# Patient Record
Sex: Female | Born: 2000 | Race: White | Hispanic: No | Smoking: Never smoker
Health system: Southern US, Community
[De-identification: ages and names within clinical notes are randomized; demographics above are authoritative.]

## PROBLEM LIST (undated history)

## (undated) DIAGNOSIS — O139 Gestational [pregnancy-induced] hypertension without significant proteinuria, unspecified trimester: Secondary | ICD-10-CM

## (undated) DIAGNOSIS — M419 Scoliosis, unspecified: Secondary | ICD-10-CM

## (undated) HISTORY — PX: TYMPANOSTOMY TUBE PLACEMENT: SHX32

## (undated) HISTORY — DX: Gestational (pregnancy-induced) hypertension without significant proteinuria, unspecified trimester: O13.9

## (undated) HISTORY — PX: WISDOM TOOTH EXTRACTION: SHX21

---
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
## 2018-01-10 ENCOUNTER — Other Ambulatory Visit: Payer: Self-pay

## 2018-01-10 ENCOUNTER — Encounter (HOSPITAL_COMMUNITY): Payer: Self-pay | Admitting: *Deleted

## 2018-01-10 ENCOUNTER — Emergency Department (HOSPITAL_COMMUNITY)
Admission: EM | Admit: 2018-01-10 | Discharge: 2018-01-11 | Disposition: A | Discharge status: Home / Self Care | Payer: BLUE CROSS/BLUE SHIELD | Attending: Emergency Medicine | Admitting: Emergency Medicine

## 2018-01-10 DIAGNOSIS — O9989 Other specified diseases and conditions complicating pregnancy, childbirth and the puerperium: Secondary | ICD-10-CM | POA: Diagnosis present

## 2018-01-10 DIAGNOSIS — Z3A01 Less than 8 weeks gestation of pregnancy: Secondary | ICD-10-CM | POA: Insufficient documentation

## 2018-01-10 DIAGNOSIS — O2341 Unspecified infection of urinary tract in pregnancy, first trimester: Secondary | ICD-10-CM | POA: Insufficient documentation

## 2018-01-10 DIAGNOSIS — R319 Hematuria, unspecified: Secondary | ICD-10-CM

## 2018-01-10 DIAGNOSIS — R102 Pelvic and perineal pain: Secondary | ICD-10-CM

## 2018-01-10 DIAGNOSIS — Z79899 Other long term (current) drug therapy: Secondary | ICD-10-CM | POA: Diagnosis not present

## 2018-01-10 DIAGNOSIS — Z3491 Encounter for supervision of normal pregnancy, unspecified, first trimester: Secondary | ICD-10-CM | POA: Insufficient documentation

## 2018-01-10 LAB — CBC WITH DIFFERENTIAL/PLATELET
Basophils Absolute: 0 10*3/uL (ref 0.0–0.1)
Basophils Relative: 0 %
Eosinophils Absolute: 0.3 10*3/uL (ref 0.0–0.7)
Eosinophils Relative: 3 %
HCT: 36.6 % (ref 36.0–46.0)
Hemoglobin: 11.5 g/dL — ABNORMAL LOW (ref 12.0–15.0)
Lymphocytes Relative: 18 %
Lymphs Abs: 1.9 10*3/uL (ref 0.7–4.0)
MCH: 27.3 pg (ref 26.0–34.0)
MCHC: 31.4 g/dL (ref 30.0–36.0)
MCV: 86.9 fL (ref 78.0–100.0)
Monocytes Absolute: 0.7 10*3/uL (ref 0.1–1.0)
Monocytes Relative: 6 %
Neutro Abs: 7.8 10*3/uL — ABNORMAL HIGH (ref 1.7–7.7)
Neutrophils Relative %: 73 %
Platelets: 243 10*3/uL (ref 150–400)
RBC: 4.21 MIL/uL (ref 3.87–5.11)
RDW: 13.2 % (ref 11.5–15.5)
WBC: 10.7 10*3/uL — ABNORMAL HIGH (ref 4.0–10.5)

## 2018-01-10 LAB — URINALYSIS, ROUTINE W REFLEX MICROSCOPIC
Bilirubin Urine: NEGATIVE
Glucose, UA: NEGATIVE mg/dL
Ketones, ur: 20 mg/dL — AB
Nitrite: NEGATIVE
Protein, ur: 100 mg/dL — AB
Specific Gravity, Urine: 1.028 (ref 1.005–1.030)
pH: 6 (ref 5.0–8.0)

## 2018-01-10 LAB — BASIC METABOLIC PANEL
Anion gap: 12 (ref 5–15)
BUN: 11 mg/dL (ref 6–20)
CO2: 21 mmol/L — ABNORMAL LOW (ref 22–32)
Calcium: 9.6 mg/dL (ref 8.9–10.3)
Chloride: 106 mmol/L (ref 101–111)
Creatinine, Ser: 0.87 mg/dL (ref 0.44–1.00)
GFR calc Af Amer: >60 mL/min (ref >60)
GFR calc non Af Amer: >60 mL/min (ref >60)
Glucose, Bld: 107 mg/dL — ABNORMAL HIGH (ref 65–99)
Potassium: 3.8 mmol/L (ref 3.5–5.1)
Sodium: 139 mmol/L (ref 135–145)

## 2018-01-10 LAB — HCG, QUANTITATIVE, PREGNANCY: hCG, Beta Chain, Quant, S: 71129 m[IU]/mL — ABNORMAL HIGH (ref <5)

## 2018-01-10 MED ORDER — HYDROMORPHONE HCL 1 MG/ML IJ SOLN
1.0000 mg | Freq: Once | INTRAMUSCULAR | Status: AC
  Administered 2018-01-10: 1 mg via INTRAVENOUS
  Filled 2018-01-10: qty 1

## 2018-01-10 MED ORDER — SODIUM CHLORIDE 0.9 % IV BOLUS (SEPSIS)
1000.0000 mL | Freq: Once | INTRAVENOUS | Status: AC
  Administered 2018-01-10: 1000 mL via INTRAVENOUS

## 2018-01-10 NOTE — ED Notes (Signed)
DR. Hyacinth MeekerMILLER AWARE OF THIS PT AND HER SYMPTOMS

## 2018-01-10 NOTE — ED Triage Notes (Addendum)
Pt reports left sided flank pain that radiates around to her lower abdomen. Pt also reports n/v and chills.  Pt states her last period started on January, 19th, 2019. EDD would be 08/28/18. (7weeks 1 day)

## 2018-01-11 ENCOUNTER — Emergency Department (HOSPITAL_COMMUNITY): Payer: BLUE CROSS/BLUE SHIELD

## 2018-01-11 MED ORDER — CEPHALEXIN 500 MG PO CAPS: 500.0000 mg | ORAL_CAPSULE | Freq: Two times a day (BID) | ORAL | 0 refills | Status: DC

## 2018-01-11 NOTE — ED Notes (Signed)
Pt to US at this time.

## 2018-01-11 NOTE — ED Notes (Signed)
Pt still in US

## 2018-01-11 NOTE — Discharge Instructions (Addendum)
See your Physician tomorrow as scheduled.   Return to the ER for any vomiting, fever above 100, back pain over the next 24-48 hours

## 2018-01-11 NOTE — ED Provider Notes (Signed)
[facility] EMERGENCY [facility]PARTMENT Provider Note   CSN: [id number] Arrival date & time: 01/10/18  2210     History   Chief Complaint Chief Complaint  Patient presents with  . Back Pain    HPI Shelby Acevedo is a 17 y.o. female.  The history is provided by the patient. No language interpreter was used.  Back Pain   This is a new problem. The current episode started 6 to 12 hours ago. The problem occurs constantly. The problem has been rapidly worsening. The pain is associated with no known injury. The quality of the pain is described as stabbing and shooting. The pain is the same all the time. Associated symptoms include abdominal pain. She has tried nothing for the symptoms.  Pt complains of low back pain that radiates to lower abdomen.  Pt is early pregnant.  Pt denies any bleeding.    Past Medical History:  Diagnosis Date  . Scoliosis     There are no active problems to display for this patient.   Past Surgical History:  Procedure Laterality Date  . TYMPANOSTOMY TUBE PLACEMENT      OB History    Gravida Para Term Preterm AB Living   1 0 0 0 0     SAB TAB Ectopic Multiple Live Births   0 0 0           Home Medications    Prior to Admission medications   Medication Sig Start Date End Date Taking? Authorizing Provider  DASETTA 1/35 tablet Take 1 tablet by mouth daily. 02/11/17   [provider]  ibuprofen (ADVIL,MOTRIN) 800 MG tablet Take 1 tablet (800 mg total) by mouth 3 (three) times daily. 05/08/17   Eber HongMiller, Brian, MD    Family History No family history on file.  Social History Social History   Tobacco Use  . Smoking status: Never Smoker  . Smokeless tobacco: Never Used  Substance Use Topics  . Alcohol use: No  . Drug use: No     Allergies   Patient has no known allergies.   Review of Systems Review of Systems  Gastrointestinal: Positive for abdominal pain.  Musculoskeletal: Positive for back pain.  All other systems reviewed and  are negative.    Physical Exam Updated Vital Signs BP 118/75 (BP Location: Right Arm)   Pulse 97   Temp 97.7 F (36.5 C) (Oral)   Resp 20   Ht 5\' 6"  (1.676 m)   Wt 72.2 kg (159 lb 4 oz)   LMP 11/21/2017 (Exact Date)   SpO2 100%   BMI 25.70 kg/m   Physical Exam  Constitutional: She is oriented to person, place, and time. She appears well-developed and well-nourished.  HENT:  Head: Normocephalic.  Right Ear: External ear normal.  Left Ear: External ear normal.  Mouth/Throat: Oropharynx is clear and moist.  Eyes: EOM are normal. Pupils are equal, round, and reactive to light.  Neck: Normal range of motion.  Cardiovascular: Normal rate and regular rhythm.  Pulmonary/Chest: Effort normal and breath sounds normal.  Abdominal: Soft. She exhibits no distension. There is tenderness.  Tender left lower abdomen.  Tender left flank  Musculoskeletal: Normal range of motion.  Neurological: She is alert and oriented to person, place, and time.  Skin: Skin is warm.  Psychiatric: She has a normal mood and affect.  Nursing note and vitals reviewed.    ED Treatments / Results  Labs (all labs ordered are listed, but only abnormal results are displayed) Labs  Reviewed  CBC WITH DIFFERENTIAL/PLATELET - Abnormal; Notable for the following components:      Result Value   WBC 10.7 (*)    Hemoglobin 11.5 (*)    Neutro Abs 7.8 (*)    All other components within normal limits  HCG, QUANTITATIVE, PREGNANCY - Abnormal; Notable for the following components:   hCG, Beta Chain, Quant, S 71,129 (*)    All other components within normal limits  BASIC METABOLIC PANEL - Abnormal; Notable for the following components:   CO2 21 (*)    Glucose, Bld 107 (*)    All other components within normal limits  URINALYSIS, ROUTINE W REFLEX MICROSCOPIC - Abnormal; Notable for the following components:   APPearance CLOUDY (*)    Hgb urine dipstick LARGE (*)    Ketones, ur 20 (*)    Protein, ur 100 (*)     Leukocytes, UA TRACE (*)    Bacteria, UA RARE (*)    Squamous Epithelial / LPF 6-30 (*)    All other components within normal limits    EKG  EKG Interpretation None      Bedside ultrasound by Dr. Hyacinth Meeker,  Probable left hydronephrosis.  Blood in urine,   We will obtain formal ultrasound due to pt's pain severity.   Radiology No results found.  Procedures Procedures (including critical care time)  Medications Ordered in ED Medications  sodium chloride 0.9 % bolus 1,000 mL (1,000 mLs Intravenous New Bag/Given 01/10/18 2248)  HYDROmorphone (DILAUDID) injection 1 mg (1 mg Intravenous Given 01/10/18 2337)     Initial Impression / Assessment and Plan / ED Course  I have reviewed the triage vital signs and the nursing notes.  Pertinent labs & imaging results that were available during my care of the patient were reviewed by me and considered in my medical decision making (see chart for details).       Final Clinical Impressions(s) / ED Diagnoses   Final diagnoses:  Hematuria, unspecified type  Normal IUP (intrauterine pregnancy) on prenatal ultrasound, first trimester    ED Discharge Orders        Ordered    cephALEXin (KEFLEX) 500 MG capsule  2 times daily     01/11/18 0236       Osie Cheeks 01/12/18 1032    Eber Hong, MD 01/12/18 1552

## 2018-01-11 NOTE — ED Provider Notes (Signed)
Ultrasound imaging reveals IUP, no complications.  Renal ultrasound was unremarkable per radiology.  Patient is improved.  Due to hematuria, white cells in urine, will add on Keflex for 7 days.  Advised to follow-up with OB.  We discussed strict return precautions   Zadie RhineWickline, Harmani Neto, MD 01/11/18 [phone]

## 2018-05-14 ENCOUNTER — Encounter

## 2018-06-25 ENCOUNTER — Emergency Department (HOSPITAL_COMMUNITY): Payer: Medicaid Other

## 2018-06-25 ENCOUNTER — Other Ambulatory Visit: Payer: Self-pay

## 2018-06-25 ENCOUNTER — Encounter (HOSPITAL_COMMUNITY): Payer: Self-pay | Admitting: Emergency Medicine

## 2018-06-25 ENCOUNTER — Emergency Department (HOSPITAL_COMMUNITY)
Admission: EM | Admit: 2018-06-25 | Discharge: 2018-06-25 | Disposition: A | Discharge status: Home / Self Care | Payer: Medicaid Other | Attending: Emergency Medicine | Admitting: Emergency Medicine

## 2018-06-25 DIAGNOSIS — S93402A Sprain of unspecified ligament of left ankle, initial encounter: Secondary | ICD-10-CM | POA: Diagnosis not present

## 2018-06-25 DIAGNOSIS — Z3A30 30 weeks gestation of pregnancy: Secondary | ICD-10-CM | POA: Insufficient documentation

## 2018-06-25 DIAGNOSIS — O9989 Other specified diseases and conditions complicating pregnancy, childbirth and the puerperium: Secondary | ICD-10-CM | POA: Diagnosis present

## 2018-06-25 DIAGNOSIS — R52 Pain, unspecified: Secondary | ICD-10-CM

## 2018-06-25 DIAGNOSIS — Z79899 Other long term (current) drug therapy: Secondary | ICD-10-CM | POA: Diagnosis not present

## 2018-06-25 DIAGNOSIS — Y33XXXA Other specified events, undetermined intent, initial encounter: Secondary | ICD-10-CM | POA: Insufficient documentation

## 2018-06-25 DIAGNOSIS — M79605 Pain in left leg: Secondary | ICD-10-CM | POA: Diagnosis not present

## 2018-06-25 DIAGNOSIS — Y939 Activity, unspecified: Secondary | ICD-10-CM | POA: Diagnosis not present

## 2018-06-25 DIAGNOSIS — Y998 Other external cause status: Secondary | ICD-10-CM | POA: Insufficient documentation

## 2018-06-25 DIAGNOSIS — Y929 Unspecified place or not applicable: Secondary | ICD-10-CM | POA: Diagnosis not present

## 2018-06-25 DIAGNOSIS — IMO0001 Reserved for inherently not codable concepts without codable children: Secondary | ICD-10-CM

## 2018-06-25 NOTE — ED Triage Notes (Addendum)
Patient complaining of left leg pain from thigh to foot x 1 week. Denies injury at that time but states she twisted her left ankle 2 nights ago and pain is worse. Patient is 8 months pregnant. Denies shortness of breath. Palpable pedal pulse at triage.

## 2018-06-25 NOTE — ED Notes (Signed)
[facility] RN recommended serial q15 min bp readings x3 and US of left extremity due to increased risk of DVT. PA aware.

## 2018-06-25 NOTE — ED Provider Notes (Signed)
[facility] EMERGENCY DEPARTMENT Provider Note   CSN: [id number] Arrival date & time: 06/25/18  1032     History   Chief Complaint Chief Complaint  Patient presents with  . Leg Pain    HPI Shelby Acevedo is a 17 y.o. female.  The history is provided by the patient.  Leg Pain   This is a new problem. The current episode started more than 2 days ago. The problem occurs constantly. The problem has been gradually worsening. The pain is present in the left ankle. Associated symptoms include limited range of motion. She has tried nothing for the symptoms. The treatment provided no relief.   Pt reports she has had soreness in her left upper leg for 2 weeks.  Pain in in the upper left outer leg.   Pt is [redacted] weeks pregnant.  She is followed by [facility] in [place] Past Medical History:  Diagnosis Date  . Scoliosis     There are no active problems to display for this patient.   Past Surgical History:  Procedure Laterality Date  . TYMPANOSTOMY TUBE PLACEMENT       OB History    Gravida  1   Para  0   Term  0   Preterm  0   AB  0   Living        SAB  0   TAB  0   Ectopic  0   Multiple      Live Births               Home Medications    Prior to Admission medications   Medication Sig Start Date End Date Taking? Authorizing Provider  cephALEXin (KEFLEX) 500 MG capsule Take 1 capsule (500 mg total) by mouth 2 (two) times daily. 01/11/18   Zadie Rhine, MD  DASETTA 1/35 tablet Take 1 tablet by mouth daily. 02/11/17   [provider]  ibuprofen (ADVIL,MOTRIN) 800 MG tablet Take 1 tablet (800 mg total) by mouth 3 (three) times daily. 05/08/17   Eber Hong, MD    Family History History reviewed. No pertinent family history.  Social History Social History   Tobacco Use  . Smoking status: Never Smoker  . Smokeless tobacco: Never Used  Substance Use Topics  . Alcohol use: No  . Drug use: No     Allergies   Patient has no known  allergies.   Review of Systems Review of Systems  All other systems reviewed and are negative.    Physical Exam Updated Vital Signs BP (!) 141/88 (BP Location: Right Arm)   Pulse 81   Temp 98.2 F (36.8 C) (Oral)   Resp 16   Ht 5\' 4"  (1.626 m)   Wt 78.5 kg   LMP 11/21/2017 (Exact Date)   SpO2 100%   BMI 29.70 kg/m   Physical Exam  Constitutional: She appears well-developed and well-nourished.  HENT:  Head: Normocephalic.  Musculoskeletal: She exhibits tenderness.  Tender left ankle,  Pain with movement, negative homan's   Neurological: She is alert.  Skin: Skin is warm.  Psychiatric: She has a normal mood and affect.  Nursing note and vitals reviewed.    ED Treatments / Results  Labs (all labs ordered are listed, but only abnormal results are displayed) Labs Reviewed - No data to display  EKG None  Radiology No results found.  Procedures Procedures (including critical care time)  Medications Ordered in ED Medications - No data to display   Initial Impression /  Assessment and Plan / ED Course  I have reviewed the triage vital signs and the nursing notes.  Pertinent labs & imaging results that were available during my care of the patient were reviewed by me and considered in my medical decision making (see chart for details).  Clinical Course as of Jun 25 1540  Fri Jun 25, 2018  1249 US Venous Img Lower Unilateral Left [LS]    Clinical Course User Index [LS] Osie CheeksSofia, Leslie K, PA-C    [facility] nurse reports normal strip,  Doppler study is normal.   Pt placed in aso for ankle sprain.  Pt advised tylenol for pain   Final Clinical Impressions(s) / ED Diagnoses   Final diagnoses:  Left leg pain  First degree ankle sprain, left, initial encounter    ED Discharge Orders    None    An After Visit Summary was printed and given to the patient.    Elson AreasSofia, Leslie K, [place]-C 06/25/18 1542    Eber HongMiller, Brian, MD 06/26/18 [phone]

## 2018-06-25 NOTE — ED Notes (Signed)
Call to Erin at [facility] for monitoring patient, recommendation for doppler for heart rate.

## 2018-06-25 NOTE — ED Notes (Signed)
Shelby Acevedo, [facility] with recommendations for OB clear, may take off the monitor, follow up with OB, discharge instructions as recommended.

## 2018-06-25 NOTE — Discharge Instructions (Signed)
Tylenol for pain.  See your Physician for recheck as scheduled

## 2018-06-25 NOTE — Progress Notes (Addendum)
1103 Received call about this 17 yo G1P0 @ 30.[redacted] wks GA in with complaint of left leg pain.  [place]  She has no pregnancy related complaints. 1109  Dr. Vergie LivingPickens consulted and informed of above.  Upon reviewing VS, BP noted to be elevated for pregnancy @ 141/88.  Dr. Vergie LivingPickens recommends repeat BP Q 15min X 3-4 and, if remains elevated pt will need pre-eclampsia labs and likely [facility]. In addition he recommends Doppler of left ankle. 1116 Dr. Vergie LivingPickens' recommendations passed on to ED RN.[id number] Dr. Vergie LivingPickens updated with serial BP readings and negative Doppler findings.  States pt can be OB cleared.

## 2018-06-25 NOTE — ED Notes (Signed)
Fetal heart rate by doppler 136.

## 2018-08-31 DIAGNOSIS — O1400 Mild to moderate pre-eclampsia, unspecified trimester: Secondary | ICD-10-CM

## 2019-03-09 ENCOUNTER — Emergency Department (HOSPITAL_COMMUNITY)
Admission: EM | Admit: 2019-03-09 | Discharge: 2019-03-09 | Disposition: A | Discharge status: Home / Self Care | Payer: Medicaid Other | Attending: Emergency Medicine | Admitting: Emergency Medicine

## 2019-03-09 ENCOUNTER — Encounter (HOSPITAL_COMMUNITY): Payer: Self-pay | Admitting: Emergency Medicine

## 2019-03-09 ENCOUNTER — Other Ambulatory Visit: Payer: Self-pay

## 2019-03-09 DIAGNOSIS — Z5321 Procedure and treatment not carried out due to patient leaving prior to being seen by health care provider: Secondary | ICD-10-CM | POA: Insufficient documentation

## 2019-03-09 DIAGNOSIS — M545 Low back pain: Secondary | ICD-10-CM | POA: Insufficient documentation
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
## 2019-08-04 ENCOUNTER — Other Ambulatory Visit: Payer: Self-pay

## 2019-08-04 DIAGNOSIS — Z20822 Contact with and (suspected) exposure to covid-19: Secondary | ICD-10-CM

## 2019-08-05 LAB — NOVEL CORONAVIRUS, NAA: SARS-CoV-2, NAA: NOT DETECTED

## 2019-08-31 ENCOUNTER — Encounter (HOSPITAL_COMMUNITY): Payer: Self-pay | Admitting: Emergency Medicine

## 2019-08-31 ENCOUNTER — Emergency Department (HOSPITAL_COMMUNITY)
Admission: EM | Admit: 2019-08-31 | Discharge: 2019-08-31 | Disposition: A | Discharge status: Home / Self Care | Payer: Medicaid Other | Attending: Emergency Medicine | Admitting: Emergency Medicine

## 2019-08-31 ENCOUNTER — Other Ambulatory Visit: Payer: Self-pay

## 2019-08-31 DIAGNOSIS — Z5321 Procedure and treatment not carried out due to patient leaving prior to being seen by health care provider: Secondary | ICD-10-CM | POA: Insufficient documentation

## 2019-08-31 DIAGNOSIS — M545 Low back pain: Secondary | ICD-10-CM | POA: Insufficient documentation

## 2019-08-31 DIAGNOSIS — R102 Pelvic and perineal pain: Secondary | ICD-10-CM | POA: Insufficient documentation

## 2019-08-31 DIAGNOSIS — R319 Hematuria, unspecified: Secondary | ICD-10-CM | POA: Insufficient documentation

## 2019-08-31 NOTE — ED Triage Notes (Signed)
Pt c/o left lower back pain with vaginal pain. Pt states there is blood in her urine that started this am.

## 2019-08-31 NOTE — ED Notes (Signed)
Registration seen pt leave the facility

## 2019-11-04 ENCOUNTER — Emergency Department (HOSPITAL_COMMUNITY)
Admission: EM | Admit: 2019-11-04 | Discharge: 2019-11-04 | Disposition: A | Discharge status: Home / Self Care | Payer: BC Managed Care – PPO | Attending: Emergency Medicine | Admitting: Emergency Medicine

## 2019-11-04 ENCOUNTER — Encounter (HOSPITAL_COMMUNITY): Payer: Self-pay

## 2019-11-04 ENCOUNTER — Emergency Department (HOSPITAL_COMMUNITY): Payer: BC Managed Care – PPO

## 2019-11-04 ENCOUNTER — Other Ambulatory Visit: Payer: Self-pay

## 2019-11-04 DIAGNOSIS — Z79899 Other long term (current) drug therapy: Secondary | ICD-10-CM | POA: Insufficient documentation

## 2019-11-04 DIAGNOSIS — Y92410 Unspecified street and highway as the place of occurrence of the external cause: Secondary | ICD-10-CM | POA: Diagnosis not present

## 2019-11-04 DIAGNOSIS — Y999 Unspecified external cause status: Secondary | ICD-10-CM | POA: Insufficient documentation

## 2019-11-04 DIAGNOSIS — S62355A Nondisplaced fracture of shaft of fourth metacarpal bone, left hand, initial encounter for closed fracture: Secondary | ICD-10-CM | POA: Insufficient documentation

## 2019-11-04 DIAGNOSIS — S6992XA Unspecified injury of left wrist, hand and finger(s), initial encounter: Secondary | ICD-10-CM | POA: Diagnosis present

## 2019-11-04 DIAGNOSIS — R0789 Other chest pain: Secondary | ICD-10-CM | POA: Insufficient documentation

## 2019-11-04 DIAGNOSIS — M25511 Pain in right shoulder: Secondary | ICD-10-CM | POA: Diagnosis not present

## 2019-11-04 DIAGNOSIS — Y9389 Activity, other specified: Secondary | ICD-10-CM | POA: Insufficient documentation

## 2019-11-04 DIAGNOSIS — T1490XA Injury, unspecified, initial encounter: Secondary | ICD-10-CM

## 2019-11-04 MED ORDER — HYDROCODONE-ACETAMINOPHEN 5-325 MG PO TABS: 1.0000 | ORAL_TABLET | Freq: Four times a day (QID) | ORAL | 0 refills | Status: DC | PRN

## 2019-11-04 MED ORDER — OXYCODONE-ACETAMINOPHEN 5-325 MG PO TABS
1.0000 | ORAL_TABLET | Freq: Once | ORAL | Status: AC
  Administered 2019-11-04: 02:00:00 1 via ORAL
  Filled 2019-11-04: qty 1
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
## 2019-12-07 ENCOUNTER — Other Ambulatory Visit: Payer: Self-pay

## 2019-12-07 ENCOUNTER — Ambulatory Visit: Payer: Medicaid Other | Attending: Internal Medicine

## 2019-12-07 DIAGNOSIS — Z20822 Contact with and (suspected) exposure to covid-19: Secondary | ICD-10-CM

## 2019-12-08 LAB — NOVEL CORONAVIRUS, NAA: SARS-CoV-2, NAA: NOT DETECTED

## 2020-06-14 ENCOUNTER — Emergency Department (HOSPITAL_COMMUNITY)
Admission: EM | Admit: 2020-06-14 | Discharge: 2020-06-14 | Disposition: A | Discharge status: Home / Self Care | Payer: BC Managed Care – PPO | Attending: Emergency Medicine | Admitting: Emergency Medicine

## 2020-06-14 ENCOUNTER — Other Ambulatory Visit: Payer: Self-pay

## 2020-06-14 ENCOUNTER — Encounter (HOSPITAL_COMMUNITY): Payer: Self-pay | Admitting: *Deleted

## 2020-06-14 DIAGNOSIS — R109 Unspecified abdominal pain: Secondary | ICD-10-CM | POA: Diagnosis not present

## 2020-06-14 DIAGNOSIS — Z3A09 9 weeks gestation of pregnancy: Secondary | ICD-10-CM | POA: Insufficient documentation

## 2020-06-14 DIAGNOSIS — O26891 Other specified pregnancy related conditions, first trimester: Secondary | ICD-10-CM | POA: Diagnosis present

## 2020-06-14 DIAGNOSIS — Z5321 Procedure and treatment not carried out due to patient leaving prior to being seen by health care provider: Secondary | ICD-10-CM | POA: Diagnosis not present

## 2020-06-14 NOTE — ED Triage Notes (Signed)
Pt with abd cramping with no vaginal bleeding or discharge.  Pt is [redacted] weeks pregnant, this is pt's second pregnancy.  Denies any burning with urination.

## 2020-09-06 ENCOUNTER — Ambulatory Visit (INDEPENDENT_AMBULATORY_CARE_PROVIDER_SITE_OTHER): Payer: BC Managed Care – PPO | Admitting: Advanced Practice Midwife

## 2020-09-06 ENCOUNTER — Ambulatory Visit (INDEPENDENT_AMBULATORY_CARE_PROVIDER_SITE_OTHER): Payer: BC Managed Care – PPO

## 2020-09-06 ENCOUNTER — Other Ambulatory Visit: Payer: Self-pay | Admitting: Advanced Practice Midwife

## 2020-09-06 ENCOUNTER — Encounter: Payer: Self-pay | Admitting: Advanced Practice Midwife

## 2020-09-06 ENCOUNTER — Other Ambulatory Visit: Payer: BC Managed Care – PPO

## 2020-09-06 VITALS — BP 120/76 | HR 80 | Ht 64.0 in | Wt 153.0 lb

## 2020-09-06 DIAGNOSIS — Z1389 Encounter for screening for other disorder: Secondary | ICD-10-CM | POA: Diagnosis not present

## 2020-09-06 DIAGNOSIS — Z331 Pregnant state, incidental: Secondary | ICD-10-CM

## 2020-09-06 DIAGNOSIS — Z3A01 Less than 8 weeks gestation of pregnancy: Secondary | ICD-10-CM | POA: Diagnosis not present

## 2020-09-06 DIAGNOSIS — O3680X0 Pregnancy with inconclusive fetal viability, not applicable or unspecified: Secondary | ICD-10-CM | POA: Diagnosis not present

## 2020-09-06 LAB — POCT URINALYSIS DIPSTICK OB
Blood, UA: NEGATIVE
Glucose, UA: NEGATIVE
Ketones, UA: NEGATIVE
Leukocytes, UA: NEGATIVE
Nitrite, UA: NEGATIVE
POC,PROTEIN,UA: NEGATIVE

## 2020-09-06 NOTE — Progress Notes (Signed)
[facility] Visit  Patient name: Shelby Acevedo MRN [id number]  Date of birth: 19-Feb-2001  CC & HPI:  Shelby Acevedo is a 19 y.o. Caucasian female presenting today for + UPT.  Very anxious because she had and early SAB in April.  No C/O, feels OK.   Pertinent History Reviewed:  Medical & Surgical Hx:   Past Medical History:  Diagnosis Date  . Scoliosis    Past Surgical History:  Procedure Laterality Date  . TYMPANOSTOMY TUBE PLACEMENT     Family History  Problem Relation Age of Onset  . Hypertension Mother     Current Outpatient Medications:  .  OVER THE COUNTER MEDICATION, One day prenatal vitamin, Disp: , Rfl:  .  cephALEXin (KEFLEX) 500 MG capsule, Take 1 capsule (500 mg total) by mouth 2 (two) times daily. (Patient not taking: Reported on 09/06/2020), Disp: 14 capsule, Rfl: 0 .  DASETTA 1/35 tablet, Take 1 tablet by mouth daily. (Patient not taking: Reported on 09/06/2020), Disp: , Rfl: 12 .  HYDROcodone-acetaminophen (NORCO/VICODIN) 5-325 MG tablet, Take 1 tablet by mouth every 6 (six) hours as needed. (Patient not taking: Reported on 09/06/2020), Disp: 10 tablet, Rfl: 0 .  ibuprofen (ADVIL,MOTRIN) 800 MG tablet, Take 1 tablet (800 mg total) by mouth 3 (three) times daily. (Patient not taking: Reported on 09/06/2020), Disp: 21 tablet, Rfl: 0 Social History: Reviewed -  reports that she has never smoked. She has never used smokeless tobacco.  Review of Systems:   Constitutional: Negative for fever and chills Eyes: Negative for visual disturbances Respiratory: Negative for shortness of breath, dyspnea Cardiovascular: Negative for chest pain or palpitations  Gastrointestinal: Negative for vomiting, diarrhea and constipation; no abdominal pain Genitourinary: Negative for dysuria and urgency, vaginal irritation or itching Musculoskeletal: Negative for back pain, joint pain, myalgias  Neurological: Negative for dizziness and headaches    Objective Findings:     Physical Examination: Vitals:   09/06/20 1448  BP: 120/76  Pulse: 80   General appearance - well appearing, and in no distress Mental status - alert, oriented to person, place, and time Chest:  Normal respiratory effort Heart - normal rate and regular rhythm Abdomen:  Soft, nontender Pelvic: deferred Musculoskeletal:  Normal range of motion without pain Extremities:  No edema  Fetus:  [place] 5+4 wks GS with YS,no fetal pole visualized,GS 8.6 mm,normal ovaries,pt will come back for f/u ultrasound in 12 days   Results for orders placed or performed in visit on 09/06/20 (from the past 24 hour(s))  POC Urinalysis Dipstick OB   Collection Time: 09/06/20  2:58 PM  Result Value Ref Range   Color, UA     Clarity, UA     Glucose, UA Negative Negative   Bilirubin, UA     Ketones, UA neg    Spec Grav, UA     Blood, UA neg    pH, UA     POC,PROTEIN,UA Negative Negative, Trace, Small (1+), Moderate (2+), Large (3+), 4+   Urobilinogen, UA     Nitrite, UA neg    Leukocytes, UA Negative Negative   Appearance     Odor        Assessment & Plan:  A:   Early pg w/IUGS P:  F/U US 12 days   No follow-ups on file.  Scarlette Calico Cresenzo-Dishmon CNM 09/06/2020 3:12 PM

## 2020-09-06 NOTE — Progress Notes (Signed)
[place] 5+4 wks GS with YS,no fetal pole visualized,GS 8.6 mm,normal ovaries,pt will come back for f/u ultrasound in 12 days

## 2020-09-10 ENCOUNTER — Other Ambulatory Visit: Payer: BC Managed Care – PPO

## 2020-09-17 ENCOUNTER — Other Ambulatory Visit: Payer: Self-pay | Admitting: Obstetrics & Gynecology

## 2020-09-17 DIAGNOSIS — O3680X0 Pregnancy with inconclusive fetal viability, not applicable or unspecified: Secondary | ICD-10-CM

## 2020-09-18 ENCOUNTER — Other Ambulatory Visit: Payer: Self-pay

## 2020-09-18 ENCOUNTER — Ambulatory Visit (INDEPENDENT_AMBULATORY_CARE_PROVIDER_SITE_OTHER): Payer: BC Managed Care – PPO

## 2020-09-18 ENCOUNTER — Encounter: Payer: Self-pay | Admitting: Adult Health

## 2020-09-18 ENCOUNTER — Other Ambulatory Visit: Payer: Self-pay | Admitting: Obstetrics & Gynecology

## 2020-09-18 ENCOUNTER — Ambulatory Visit (INDEPENDENT_AMBULATORY_CARE_PROVIDER_SITE_OTHER): Payer: BC Managed Care – PPO | Admitting: Adult Health

## 2020-09-18 VITALS — BP 126/84 | HR 79 | Ht 64.0 in | Wt 149.5 lb

## 2020-09-18 DIAGNOSIS — O3680X0 Pregnancy with inconclusive fetal viability, not applicable or unspecified: Secondary | ICD-10-CM | POA: Diagnosis not present

## 2020-09-18 DIAGNOSIS — O021 Missed abortion: Secondary | ICD-10-CM | POA: Diagnosis not present

## 2020-09-18 DIAGNOSIS — Z3A01 Less than 8 weeks gestation of pregnancy: Secondary | ICD-10-CM

## 2020-09-18 NOTE — Progress Notes (Signed)
°  Subjective:     Patient ID: Shelby Acevedo, female   DOB: [birth year], 19 y.o.   MRN: [id number]  HPI [name] is a 19 year old white female in for follow up US.She had [place] 11/4 8.6 mm GS with YS no fetal pole,. PCP is [facility].   Review of Systems No bleeding Reviewed past medical,surgical, social and family history. Reviewed medications and allergies.     Objective:   Physical Exam BP 126/84 (BP Location: Left Arm, Patient Position: Sitting, Cuff Size: Normal)    Pulse 79    Ht 5\' 4"  (1.626 m)    Wt 149 lb 8 oz (67.8 kg)    LMP 07/26/2020    Breastfeeding No    BMI 25.66 kg/m   Talk only 07/28/2020 showed 22.8 mm sac no YS or fetal pole She had QHCG of 13,195 on 11.9 at [facility] She said she had miscarriage in August    Assessment:     1. Missed abortion Discussed waiting or using Cytotec, she will let me know tomorrow what she wants to do    Plan:     Check QHCG and ABO RH Follow up in 2 weeks

## 2020-09-18 NOTE — Progress Notes (Signed)
[place] TA/TV:GS 24.7 mm=7+1 wks,no fetal pole or YS visualized,normal ovaries,discussed results with Victorino Dike

## 2020-09-20 ENCOUNTER — Telehealth: Payer: Self-pay | Admitting: Adult Health

## 2020-09-20 MED ORDER — MISOPROSTOL 200 MCG PO TABS: ORAL_TABLET | ORAL | 1 refills | Status: DC

## 2020-09-20 NOTE — Addendum Note (Signed)
Addended by: Cyril Mourning A on: 09/20/2020 04:48 PM   Modules accepted: Orders
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
## 2020-09-20 NOTE — Telephone Encounter (Signed)
Pt's mom answered the phone and ask if this was about meds and I told her that meds had been sent to [facility]

## 2020-10-02 ENCOUNTER — Ambulatory Visit: Payer: BC Managed Care – PPO | Admitting: Adult Health
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
## 2022-05-28 ENCOUNTER — Other Ambulatory Visit: Payer: Self-pay | Admitting: *Deleted

## 2022-05-28 DIAGNOSIS — I8393 Asymptomatic varicose veins of bilateral lower extremities: Secondary | ICD-10-CM

## 2022-07-01 ENCOUNTER — Ambulatory Visit (HOSPITAL_COMMUNITY)
Admission: RE | Admit: 2022-07-01 | Discharge: 2022-07-01 | Disposition: A | Discharge status: Home / Self Care | Payer: Medicaid Other | Source: Ambulatory Visit | Attending: Vascular Surgery | Admitting: Vascular Surgery

## 2022-07-01 ENCOUNTER — Ambulatory Visit (INDEPENDENT_AMBULATORY_CARE_PROVIDER_SITE_OTHER): Payer: Medicaid Other | Admitting: Physician Assistant

## 2022-07-01 VITALS — BP 113/75 | HR 69 | Temp 98.6°F | Ht 64.0 in | Wt 141.0 lb

## 2022-07-01 DIAGNOSIS — I8393 Asymptomatic varicose veins of bilateral lower extremities: Secondary | ICD-10-CM

## 2022-07-01 NOTE — Progress Notes (Signed)
Office Note     CC:  follow up Requesting Provider:  Medicine, [facility]  HPI: Shelby Acevedo is a 21 y.o. ([birth year]) female who presents for evaluation of right lower extremity varicose veins.  Patient states her varicose veins worsen after each pregnancy she has now had 2 children.  She denies any history of DVT, venous ulcerations, trauma, or prior vascular intervention.  She does not wear compression or make an effort to elevate her legs during the day.  She would like to possibly have more children in the future.  Past Medical History:  Diagnosis Date   Scoliosis     Past Surgical History:  Procedure Laterality Date   TYMPANOSTOMY TUBE PLACEMENT      Social History   Socioeconomic History   Marital status: Single    Spouse name: Not on file   Number of children: Not on file   Years of education: Not on file   Highest education level: Not on file  Occupational History   Not on file  Tobacco Use   Smoking status: Never   Smokeless tobacco: Never  Vaping Use   Vaping Use: Every day  Substance and Sexual Activity   Alcohol use: No   Drug use: No   Sexual activity: Yes    Birth control/protection: None  Other Topics Concern   Not on file  Social History Narrative   ** Merged History Encounter **       Social Determinants of Health   Financial Resource Strain: Low Risk  (09/06/2020)   Overall Financial Resource Strain (CARDIA)    Difficulty of Paying Living Expenses: Not hard at all  Food Insecurity: No Food Insecurity (09/06/2020)   Hunger Vital Sign    Worried About Running Out of Food in the Last Year: Never true    Ran Out of Food in the Last Year: Never true  Transportation Needs: No Transportation Needs (09/06/2020)   PRAPARE - Administrator, Civil Service (Medical): No    Lack of Transportation (Non-Medical): No  Physical Activity: Inactive (09/06/2020)   Exercise Vital Sign    Days of Exercise per Week: 0 days    Minutes of Exercise  per Session: 0 min  Stress: Stress Concern Present (09/06/2020)   [employer] of Occupational Health - Occupational Stress Questionnaire    Feeling of Stress : To some extent  Social Connections: Moderately Isolated (09/06/2020)   Social Connection and Isolation Panel [NHANES]    Frequency of Communication with Friends and Family: More than three times a week    Frequency of Social Gatherings with Friends and Family: Twice a week    Attends Religious Services: Never    Database administrator or Organizations: No    Attends Banker Meetings: Never    Marital Status: Living with partner  Intimate Partner Violence: Not At Risk (09/06/2020)   Humiliation, Afraid, Rape, and Kick questionnaire    Fear of Current or Ex-Partner: No    Emotionally Abused: No    Physically Abused: No    Sexually Abused: No    Family History  Problem Relation Age of Onset   Hypertension Mother     Current Outpatient Medications  Medication Sig Dispense Refill   cyclobenzaprine (FLEXERIL) 5 MG tablet Take 5 mg by mouth 3 (three) times daily as needed for muscle spasms.     estradiol cypionate (DEPO-ESTRADIOL) 5 MG/ML injection Inject 1.5 mg into the muscle every 28 (twenty-eight) days.  No current facility-administered medications for this visit.    No Known Allergies   REVIEW OF SYSTEMS:   [X]  denotes positive finding, [ ]  denotes negative finding Cardiac  Comments:  Chest pain or chest pressure:    Shortness of breath upon exertion:    Short of breath when lying flat:    Irregular heart rhythm:        Vascular    Pain in calf, thigh, or hip brought on by ambulation:    Pain in feet at night that wakes you up from your sleep:     Blood clot in your veins:    Leg swelling:         Pulmonary    Oxygen at home:    Productive cough:     Wheezing:         Neurologic    Sudden weakness in arms or legs:     Sudden numbness in arms or legs:     Sudden onset of difficulty  speaking or slurred speech:    Temporary loss of vision in one eye:     Problems with dizziness:         Gastrointestinal    Blood in stool:     Vomited blood:         Genitourinary    Burning when urinating:     Blood in urine:        Psychiatric    Major depression:         Hematologic    Bleeding problems:    Problems with blood clotting too easily:        Skin    Rashes or ulcers:        Constitutional    Fever or chills:      PHYSICAL EXAMINATION:  Vitals:   07/01/22 1249  BP: 113/75  Pulse: 69  Temp: 98.6 F (37 C)  TempSrc: Temporal  SpO2: 100%  Weight: 141 lb (64 kg)  Height: 5\' 4"  (1.626 m)    General:  WDWN in NAD; vital signs documented above Gait: Not observed HENT: WNL, normocephalic Pulmonary: normal non-labored breathing , without Rales, rhonchi,  wheezing Cardiac: regular HR Abdomen: soft, NT, no masses Skin: without rashes Vascular Exam/Pulses:  Right Left  Radial 2+ (normal) 2+ (normal)  DP 2+ (normal) 2+ (normal)   Extremities: without ischemic changes, without Gangrene , without cellulitis; without open wounds; varicose vein running along the medial lower right leg musculoskeletal: no muscle wasting or atrophy  Neurologic: A&O X 3;  No focal weakness or paresthesias are detected Psychiatric:  The pt has Normal affect.   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 female here for evaluation of right leg varicosities and edema  -Right lower extremity venous reflux study negative for DVT and negative for deep venous reflux.  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  She is not a candidate for laser ablation.   Recommendations included 15 to 20 mmHg knee-high compression socks to be worn regularly during the day.  She should also make an effort to elevate her legs periodically throughout the day.  Recommendations also included avoiding prolonged sitting and standing.  If varicose veins  continue to worsen as well as other venous symptoms, we can repeat this study however would wait a calendar year.  For now she will follow-up on an as-needed basis.   Emilie Rutter, PA-C [facility] [phone]  Clinic MD:   Lenell Antu

## 2022-11-03 NOTE — L&D Delivery Note (Addendum)
LABOR COURSE Called to emergency in the parking lot for a patient in active labor. Patient was transferred to stretcher and wheeled to MAU where she was found to be completely dilated with delivery imminent.   Delivery Note Patient in MAU feeling the urge to push.  Head delivered LOA. No nuchal cord present. Shoulder and body delivered in usual fashion. At 1300 a healthy female was delivered via Vaginal, Spontaneous delivery.  Infant with spontaneous cry, placed on mother's abdomen, dried and stimulated. Cord clamped x 2 after 1-minute delay, and cut by maternal grandmother. Cord blood drawn. Placenta delivered spontaneously with gentle cord traction. Appears intact. Fundus firm with massage and Pitocin. Labia, perineum, vagina, and cervix inspected with left and right labial abrasions and a shallow 1st degree perineal laceration, all of which were hemostatic not requiring repair.   APGAR: 8, 9; weight  3355g.   Cord: 3VC with the following complications:none.    Anesthesia:  None Episiotomy: None Lacerations: As above, left and right labial abrasions and shallow 1st degree perineal laceration that was hemostatic. Suture Repair: none Est. Blood Loss (mL): 112  Mom to postpartum.  Baby to Couplet care / Skin to Skin.  Alicia Amel, MD 10/15/23 1:28 PM    GME ATTESTATION:  Evaluation and management procedures were performed by the [facility] Medicine Resident under my supervision. I was immediately available and gloved for direct supervision, assistance and direction throughout this encounter.  I also confirm that I have verified the information documented in the resident's note, and that I have also personally reperformed the pertinent components of the physical exam and all of the medical decision making activities.  I have also made any necessary editorial changes.  Wyn Forster, MD OB Fellow, Faculty Practice [facility] 10/15/2023 2:35 PM

## 2022-12-26 ENCOUNTER — Ambulatory Visit
Admission: EM | Admit: 2022-12-26 | Discharge: 2022-12-26 | Disposition: A | Discharge status: Home / Self Care | Payer: Medicaid Other | Attending: Family Medicine | Admitting: Family Medicine

## 2022-12-26 DIAGNOSIS — H1031 Unspecified acute conjunctivitis, right eye: Secondary | ICD-10-CM

## 2022-12-26 MED ORDER — OFLOXACIN 0.3 % OP SOLN: 1.0000 [drp] | Freq: Four times a day (QID) | OPHTHALMIC | 0 refills | Status: DC

## 2022-12-26 NOTE — ED Provider Notes (Signed)
[facility]    CSN: [id number] Arrival date & time: 12/26/22  1242      History   Chief Complaint No chief complaint on file.   HPI Shelby Acevedo is a 22 y.o. female.   Patient presenting today with 1 day history of right eye redness, irritation, swelling that she first noticed when she had her contacts in yesterday.  She took her contact out but has not improved thus far today.  Denies visual changes, headache, nausea, vomiting, significant eye pain but does have some photophobia.  So far not tried anything over-the-counter for symptoms.      Past Medical History:  Diagnosis Date   Scoliosis     There are no problems to display for this patient.   Past Surgical History:  Procedure Laterality Date   TYMPANOSTOMY TUBE PLACEMENT      OB History     Gravida  3   Para  1   Term  0   Preterm  0   AB  1   Living  1      SAB  1   IAB  0   Ectopic  0   Multiple      Live Births               Home Medications    Prior to Admission medications   Medication Sig Start Date End Date Taking? Authorizing Provider  ofloxacin (OCUFLOX) 0.3 % ophthalmic solution Place 1 drop into the right eye 4 (four) times daily. 12/26/22  Yes Volney American, PA-C  cyclobenzaprine (FLEXERIL) 5 MG tablet Take 5 mg by mouth 3 (three) times daily as needed for muscle spasms.    [provider]  estradiol cypionate (DEPO-ESTRADIOL) 5 MG/ML injection Inject 1.5 mg into the muscle every 28 (twenty-eight) days.    [provider]    Family History Family History  Problem Relation Age of Onset   Hypertension Mother     Social History Social History   Tobacco Use   Smoking status: Never   Smokeless tobacco: Never  Vaping Use   Vaping Use: Every day  Substance Use Topics   Alcohol use: No   Drug use: No     Allergies   Patient has no known allergies.   Review of Systems Review of Systems PER HPI  Physical  Exam Triage Vital Signs ED Triage Vitals  Enc Vitals Group     BP 12/26/22 1253 112/73     Pulse Rate 12/26/22 1253 74     Resp 12/26/22 1253 20     Temp 12/26/22 1253 98.2 F (36.8 C)     Temp Source 12/26/22 1253 Oral     SpO2 12/26/22 1253 100 %     Weight --      Height --      Head Circumference --      Peak Flow --      Pain Score 12/26/22 1255 4     Pain Loc --      Pain Edu? --      Excl. in [place]? --    No data found.  Updated Vital Signs BP 112/73 (BP Location: Right Arm)   Pulse 74   Temp 98.2 F (36.8 C) (Oral)   Resp 20   LMP 12/11/2022 (Approximate)   SpO2 100%   Visual Acuity Right Eye Distance:   Left Eye Distance:   Bilateral Distance:    Right Eye Near:   Left  Eye Near:    Bilateral Near:     Physical Exam Vitals and nursing note reviewed.  Constitutional:      Appearance: Normal appearance. She is not ill-appearing.  HENT:     Head: Atraumatic.  Eyes:     Extraocular Movements: Extraocular movements intact.     Pupils: Pupils are equal, round, and reactive to light.     Comments: Right conjunctival erythema, injection  Cardiovascular:     Rate and Rhythm: Normal rate.  Pulmonary:     Effort: Pulmonary effort is normal.  Musculoskeletal:        General: Normal range of motion.     Cervical back: Normal range of motion and neck supple.  Skin:    General: Skin is warm and dry.  Neurological:     Mental Status: She is alert and oriented to person, place, and time.  Psychiatric:        Mood and Affect: Mood normal.        Thought Content: Thought content normal.        Judgment: Judgment normal.    UC Treatments / Results  Labs (all labs ordered are listed, but only abnormal results are displayed) Labs Reviewed - No data to display  EKG  Radiology No results found.  Procedures Procedures (including critical care time)  Medications Ordered in UC Medications - No data to display  Initial Impression / Assessment and Plan / UC  Course  I have reviewed the triage vital signs and the nursing notes.  Pertinent labs & imaging results that were available during my care of the patient were reviewed by me and considered in my medical decision making (see chart for details).     Suspect contact lens conjunctivitis, keep contacts out until fully resolved, treat with Ocuflox drops, warm compresses, good hand hygiene.  Return for worsening symptoms.  Visual acuity declined today as vision intact.  Final Clinical Impressions(s) / UC Diagnoses   Final diagnoses:  Acute bacterial conjunctivitis of right eye   Discharge Instructions   None    ED Prescriptions     Medication Sig Dispense Auth. Provider   ofloxacin (OCUFLOX) 0.3 % ophthalmic solution Place 1 drop into the right eye 4 (four) times daily. 5 mL Volney American, [place]      PDMP not reviewed this encounter.   Volney American, [place] 12/26/22 1609

## 2022-12-26 NOTE — ED Notes (Signed)
Pt requested a eye covering per provider. I applied a guaze pad and med tape to right eye.

## 2022-12-26 NOTE — ED Triage Notes (Signed)
Pt reports her contacts was bothering her right she took it out and now her right eye is irritated. Red and swollen x 1 day

## 2023-02-05 ENCOUNTER — Encounter: Payer: Self-pay | Admitting: Adult Health

## 2023-02-05 ENCOUNTER — Other Ambulatory Visit: Payer: Self-pay | Admitting: Obstetrics & Gynecology

## 2023-02-05 ENCOUNTER — Ambulatory Visit (INDEPENDENT_AMBULATORY_CARE_PROVIDER_SITE_OTHER): Payer: Medicaid Other | Admitting: Adult Health

## 2023-02-05 VITALS — BP 116/77 | HR 78 | Ht 64.0 in | Wt 158.0 lb

## 2023-02-05 DIAGNOSIS — Z3201 Encounter for pregnancy test, result positive: Secondary | ICD-10-CM

## 2023-02-05 DIAGNOSIS — O3680X0 Pregnancy with inconclusive fetal viability, not applicable or unspecified: Secondary | ICD-10-CM | POA: Diagnosis not present

## 2023-02-05 DIAGNOSIS — Z3A01 Less than 8 weeks gestation of pregnancy: Secondary | ICD-10-CM

## 2023-02-05 DIAGNOSIS — O09299 Supervision of pregnancy with other poor reproductive or obstetric history, unspecified trimester: Secondary | ICD-10-CM

## 2023-02-05 DIAGNOSIS — O09291 Supervision of pregnancy with other poor reproductive or obstetric history, first trimester: Secondary | ICD-10-CM

## 2023-02-05 LAB — POCT URINE PREGNANCY: Preg Test, Ur: POSITIVE — AB

## 2023-02-05 MED ORDER — PRENATAL PLUS 27-1 MG PO TABS: 1.0000 | ORAL_TABLET | Freq: Every day | ORAL | 12 refills | Status: DC

## 2023-02-05 NOTE — Progress Notes (Signed)
Subjective:     Patient ID: Shelby Acevedo, female   DOB: [birth year], 22 y.o.   MRN: [id number]  HPI Shelby Acevedo is a  22 year old white female,with SO, [id number] in for UPT, she has missed a period and had 3+HPTs. Has mild nausea. Has 2 boys and pre eclampsia with both, was seen in [place].   PCP is [facility]  Review of Systems +missed period with 3+HPTs +nausea Reviewed past medical,surgical, social and family history. Reviewed medications and allergies.     Objective:   Physical Exam BP 116/77 (BP Location: Left Arm, Patient Position: Sitting, Cuff Size: Normal)   Pulse 78   Ht 5\' 4"  (1.626 m)   Wt 158 lb (71.7 kg)   LMP 01/02/2023   Breastfeeding No   BMI 27.12 kg/m  UPT is +, about 4+6 weeks by LMP, with EDD 10/09/23. Skin warm and dry. Neck: mid line trachea, normal thyroid, good ROM, no lymphadenopathy noted. Lungs: clear to ausculation bilaterally. Cardiovascular: regular rate and rhythm.    AA is 0 Fall risk is low    02/05/2023   10:40 AM 09/06/2020    2:47 PM  Depression screen PHQ 2/9  Decreased Interest 0 0  Down, Depressed, Hopeless 0 1  PHQ - 2 Score 0 1  Altered sleeping 1 0  Tired, decreased energy 1 0  Change in appetite 1 0  Feeling bad or failure about yourself  0 0  Trouble concentrating 0 0  Moving slowly or fidgety/restless 0 0  Suicidal thoughts 0 0  PHQ-9 Score 3 1  Difficult doing work/chores  Not difficult at all       02/05/2023   10:40 AM 09/06/2020    2:48 PM  GAD 7 : Generalized Anxiety Score  Nervous, Anxious, on Edge 1 1  Control/stop worrying 0 1  Worry too much - different things 0 0  Trouble relaxing 1 0  Restless 0 0  Easily annoyed or irritable 1 1  Afraid - awful might happen 0 0  Total GAD 7 Score 3 3  Anxiety Difficulty  Not difficult at all      Upstream - 02/05/23 1053       Pregnancy Intention Screening   Does the patient want to become pregnant in the next year? N/A    Does the patient's partner want to become pregnant in  the next year? N/A    Would the patient like to discuss contraceptive options today? N/A      Contraception Wrap Up   Current Method Pregnant/Seeking Pregnancy    End Method Pregnant/Seeking Pregnancy    Contraception Counseling Provided No             Assessment:     1. Pregnancy examination or test, positive result - POCT urine pregnancy  2. Less than [redacted] weeks gestation of pregnancy Will rx pNV Meds ordered this encounter  Medications   prenatal vitamin w/FE, FA (PRENATAL 1 + 1) 27-1 MG TABS tablet    Sig: Take 1 tablet by mouth daily at 12 noon.    Dispense:  30 tablet    Refill:  12    Order Specific Question:   Supervising Provider    Answer:   Elonda Husky, LUTHER H [[id number]]     3. Encounter to determine fetal viability of pregnancy, single or unspecified fetus Return in about 3 weeks for dating [place] - US OB Comp Less 14 Wks; Future  4. History of pre-eclampsia in prior pregnancy, currently pregnant  Plan:     Review OB packet

## 2023-02-09 ENCOUNTER — Other Ambulatory Visit: Payer: Self-pay | Admitting: Adult Health

## 2023-02-09 MED ORDER — PROMETHAZINE HCL 12.5 MG PO TABS: 12.5000 mg | ORAL_TABLET | Freq: Four times a day (QID) | ORAL | 1 refills | Status: DC | PRN

## 2023-02-09 NOTE — Progress Notes (Signed)
Rx phenergan for nausea

## 2023-02-11 ENCOUNTER — Other Ambulatory Visit: Payer: Self-pay | Admitting: Adult Health

## 2023-02-11 ENCOUNTER — Other Ambulatory Visit: Payer: Self-pay | Admitting: *Deleted

## 2023-02-11 DIAGNOSIS — O209 Hemorrhage in early pregnancy, unspecified: Secondary | ICD-10-CM

## 2023-02-11 NOTE — Progress Notes (Signed)
Ck QHCG  

## 2023-02-12 LAB — BETA HCG QUANT (REF LAB): hCG Quant: 42346 m[IU]/mL

## 2023-02-23 ENCOUNTER — Ambulatory Visit (INDEPENDENT_AMBULATORY_CARE_PROVIDER_SITE_OTHER): Payer: Medicaid Other

## 2023-02-23 DIAGNOSIS — O3680X0 Pregnancy with inconclusive fetal viability, not applicable or unspecified: Secondary | ICD-10-CM

## 2023-02-23 DIAGNOSIS — Z3481 Encounter for supervision of other normal pregnancy, first trimester: Secondary | ICD-10-CM

## 2023-02-23 DIAGNOSIS — Z3A01 Less than 8 weeks gestation of pregnancy: Secondary | ICD-10-CM | POA: Diagnosis not present

## 2023-02-23 NOTE — Progress Notes (Signed)
[place] 7+3 wks,single IUP with yolk sac,FHR 173 bpm,CRL 13.36 mm

## 2023-02-25 ENCOUNTER — Other Ambulatory Visit: Payer: Medicaid Other

## 2023-02-26 ENCOUNTER — Other Ambulatory Visit: Payer: Medicaid Other

## 2023-03-03 ENCOUNTER — Other Ambulatory Visit: Payer: Self-pay | Admitting: Obstetrics & Gynecology

## 2023-03-03 DIAGNOSIS — Z3682 Encounter for antenatal screening for nuchal translucency: Secondary | ICD-10-CM

## 2023-03-03 NOTE — Progress Notes (Signed)
NT scan order 

## 2023-03-09 ENCOUNTER — Other Ambulatory Visit: Payer: Self-pay | Admitting: Adult Health

## 2023-03-10 ENCOUNTER — Encounter: Payer: Self-pay | Admitting: Advanced Practice Midwife

## 2023-04-02 ENCOUNTER — Encounter: Payer: Self-pay | Admitting: Advanced Practice Midwife

## 2023-04-02 ENCOUNTER — Ambulatory Visit (INDEPENDENT_AMBULATORY_CARE_PROVIDER_SITE_OTHER): Payer: Medicaid Other

## 2023-04-02 ENCOUNTER — Encounter: Payer: Medicaid Other | Admitting: *Deleted

## 2023-04-02 ENCOUNTER — Ambulatory Visit (INDEPENDENT_AMBULATORY_CARE_PROVIDER_SITE_OTHER): Payer: Medicaid Other | Admitting: Advanced Practice Midwife

## 2023-04-02 VITALS — BP 122/79 | HR 68 | Wt 155.0 lb

## 2023-04-02 DIAGNOSIS — Z3A12 12 weeks gestation of pregnancy: Secondary | ICD-10-CM

## 2023-04-02 DIAGNOSIS — Z3480 Encounter for supervision of other normal pregnancy, unspecified trimester: Secondary | ICD-10-CM

## 2023-04-02 DIAGNOSIS — Z3481 Encounter for supervision of other normal pregnancy, first trimester: Secondary | ICD-10-CM

## 2023-04-02 DIAGNOSIS — O09291 Supervision of pregnancy with other poor reproductive or obstetric history, first trimester: Secondary | ICD-10-CM | POA: Diagnosis not present

## 2023-04-02 DIAGNOSIS — Z3682 Encounter for antenatal screening for nuchal translucency: Secondary | ICD-10-CM

## 2023-04-02 DIAGNOSIS — Z363 Encounter for antenatal screening for malformations: Secondary | ICD-10-CM

## 2023-04-02 DIAGNOSIS — Z3490 Encounter for supervision of normal pregnancy, unspecified, unspecified trimester: Secondary | ICD-10-CM | POA: Insufficient documentation

## 2023-04-02 DIAGNOSIS — O09299 Supervision of pregnancy with other poor reproductive or obstetric history, unspecified trimester: Secondary | ICD-10-CM

## 2023-04-02 MED ORDER — DOXYLAMINE-PYRIDOXINE 10-10 MG PO TBEC: DELAYED_RELEASE_TABLET | ORAL | 6 refills | Status: DC

## 2023-04-02 MED ORDER — BLOOD PRESSURE MONITOR MISC
0 refills | Status: AC
Start: 2023-04-02 — End: ?

## 2023-04-02 MED ORDER — ONDANSETRON 4 MG PO TBDP: 4.0000 mg | ORAL_TABLET | Freq: Four times a day (QID) | ORAL | 2 refills | Status: DC | PRN

## 2023-04-02 MED ORDER — ASPIRIN 81 MG PO TBEC: 162.0000 mg | DELAYED_RELEASE_TABLET | Freq: Every day | ORAL | 6 refills | Status: DC

## 2023-04-02 NOTE — Progress Notes (Signed)
INITIAL OBSTETRICAL VISIT Patient name: Sharley Bohner MRN [id number]  Date of birth: Dec 27, 2000 Chief Complaint:   Initial Prenatal Visit (Nausea medication not working/)  History of Present Illness:   Liddy Seitz is a 22 y.o. [phone] Caucasian female at [redacted]w[redacted]d by LMP c/w u/s at 7 weeks with an Estimated Date of Delivery: 10/09/23 being seen today for her initial obstetrical visit.   Her obstetrical history is significant for preeclampsia, first baby only.   Today she reports nausea--phenergan not working.     04/02/2023   11:08 AM 02/05/2023   10:40 AM 09/06/2020    2:47 PM  Depression screen PHQ 2/9  Decreased Interest 0 0 0  Down, Depressed, Hopeless 0 0 1  PHQ - 2 Score 0 0 1  Altered sleeping 0 1 0  Tired, decreased energy 2 1 0  Change in appetite 0 1 0  Feeling bad or failure about yourself  0 0 0  Trouble concentrating 0 0 0  Moving slowly or fidgety/restless 0 0 0  Suicidal thoughts 0 0 0  PHQ-9 Score 2 3 1   Difficult doing work/chores   Not difficult at all    Patient's last menstrual period was 01/02/2023.   Declines pap until postpartumzzzzz  Review of Systems:   Pertinent items are noted in HPI Denies cramping/contractions, leakage of fluid, vaginal bleeding, abnormal vaginal discharge w/ itching/odor/irritation, headaches, visual changes, shortness of breath, chest pain, abdominal pain, severe nausea/vomiting, or problems with urination or bowel movements unless otherwise stated above.  Pertinent History Reviewed:  Reviewed past medical,surgical, social, obstetrical and family history.  Reviewed problem list, medications and allergies. OB History  Gravida Para Term Preterm AB Living  5 2 2  0 2 2  SAB IAB Ectopic Multiple Live Births  2 0 0   2    # Outcome Date GA Lbr Len/2nd Weight Sex Delivery Anes PTL Lv  5 Current           4 Term 11/16/21 [redacted]w[redacted]d  6 lb 3 oz (2.807 kg) M Vag-Spont None N LIV  3 SAB 09/18/20          2 SAB 2021          1 Term 08/31/18  [redacted]w[redacted]d  6 lb 2 oz (2.778 kg) M Vag-Spont None N LIV     Complications: Mild pre-eclampsia   Physical Assessment:   Vitals:   04/02/23 1137  BP: 122/79  Pulse: 68  Weight: 155 lb (70.3 kg)  Body mass index is 26.61 kg/m.       Physical Examination:  General appearance - well appearing, and in no distress  Mental status - alert, oriented to person, place, and time  Psych:  She has a normal mood and affect  Skin - warm and dry, normal color, no suspicious lesions noted  Chest - effort normal  Heart - normal rate and regular rhythm  Abdomen - soft, nontender  Extremities:  No swelling or varicosities noted   TODAY'SUS 12+6 wks,measurements c/w dates,FHR 159 bpm,CRL 68.93 mm,NB present,NT 2.1 mm,normal ovaries    No results found for this or any previous visit (from the past 24 hour(s)).   Indications for ASA therapy (per uptodate) One of the following: Previous pregnancy with preeclampsia, especially early onset and with an adverse outcome Yes        04/02/2023   11:08 AM 02/05/2023   10:40 AM 09/06/2020    2:48 PM  GAD 7 : Generalized Anxiety Score  Nervous,  Anxious, on Edge 1 1 1   Control/stop worrying 0 0 1  Worry too much - different things 0 0 0  Trouble relaxing 0 1 0  Restless 0 0 0  Easily annoyed or irritable 0 1 1  Afraid - awful might happen 0 0 0  Total GAD 7 Score 1 3 3   Anxiety Difficulty   Not difficult at all      Assessment & Plan:  1) Low-Risk Pregnancy [id number] at [redacted]w[redacted]d with an Estimated Date of Delivery: 10/09/23   2) Initial OB visit    1. [redacted] weeks gestation of pregnancy   2. Supervision of normal intrauterine pregnancy in multigravida, first trimester   3. History of pre-eclampsia in prior pregnancy, currently pregnant Start ASA  4. Antenatal screening for malformation using ultrasonics  - US OB Comp + 14 Wk; Future       Meds:  Meds ordered this encounter  Medications   aspirin EC 81 MG tablet    Sig: Take 2 tablets (162 mg  total) by mouth daily.    Dispense:  60 tablet    Refill:  6    Order Specific Question:   Supervising Provider    Answer:   Lazaro Arms [[id number]]   Blood Pressure Monitor MISC    Sig: For regular home bp monitoring during pregnancy    Dispense:  1 each    Refill:  0    Z34.81 Please mail to patient   Doxylamine-Pyridoxine (DICLEGIS) 10-10 MG TBEC    Sig: Take 2 qhs; may also take one in am and one in afternoon prn nausea    Dispense:  120 tablet    Refill:  6    Order Specific Question:   Supervising Provider    Answer:   Duane Lope H [[id number]]   ondansetron (ZOFRAN-ODT) 4 MG disintegrating tablet    Sig: Take 1 tablet (4 mg total) by mouth every 6 (six) hours as needed for nausea.    Dispense:  30 tablet    Refill:  2    Order Specific Question:   Supervising Provider    Answer:   Duane Lope H [[id number]]    Initial labs obtained Continue prenatal vitamins Reviewed n/v relief measures and warning s/s to report Reviewed recommended weight gain based on pre-gravid BMI Encouraged well-balanced diet Genetic & carrier screening discussed: requests Panorama, NT/IT, and Horizon , declines AFP Ultrasound discussed; fetal survey: requested CCNC completed> form faxed if has or is planning to apply for medicaid The nature of [facility] [employer] with multiple MDs and other Advanced Practice Providers was explained to patient; also emphasized that fellows, residents, and students are part of our team. Doesn't have a home bp cuff. Rx faxed. Check bp weekly, let us know if >140/90.        Scarlette Calico Cresenzo-Dishmon 12:08 PM

## 2023-04-02 NOTE — Patient Instructions (Addendum)
Gardiner Rhyme, I greatly value your feedback.  If you receive a survey following your visit with [place] today, we appreciate you taking the time to fill it out.  Thanks, [name], DNP, CNM  [facility] HAS MOVED!!! It is now [facility] at [facility] ([address] [place], [place] [address]) Entrance located off of [address] Free 24/7 valet parking   Nausea & Vomiting Have saltine crackers or pretzels by your bed and eat a few bites before you raise your head out of bed in the morning Eat small frequent meals throughout the day instead of large meals Drink plenty of fluids throughout the day to stay hydrated, just don't drink a lot of fluids with your meals.  This can make your stomach fill up faster making you feel sick Do not brush your teeth right after you eat Products with real ginger are good for nausea, like ginger ale and ginger hard candy Make sure it says made with real ginger! Sucking on sour candy like lemon heads is also good for nausea If your prenatal vitamins make you nauseated, take them at night so you will sleep through the nausea Sea Bands If you feel like you need medicine for the nausea & vomiting please let us know If you are unable to keep any fluids or food down please let us know   Constipation Drink plenty of fluid, preferably water, throughout the day Eat foods high in fiber such as fruits, vegetables, and grains Exercise, such as walking, is a good way to keep your bowels regular Drink warm fluids, especially warm prune juice, or decaf coffee Eat a 1/2 cup of real oatmeal (not instant), 1/2 cup applesauce, and 1/2-1 cup warm prune juice every day If needed, you may take Colace (docusate sodium) stool softener once or twice a day to help keep the stool soft.  If you still are having problems with constipation, you may take Miralax once daily as needed to help keep your bowels regular.   Home Blood Pressure Monitoring for  Patients   Your provider has recommended that you check your blood pressure (BP) at least once a week at home. If you do not have a blood pressure cuff at home, one will be provided for you. Contact your provider if you have not received your monitor within 1 week.   Helpful Tips for Accurate Home Blood Pressure Checks  Don't smoke, exercise, or drink caffeine 30 minutes before checking your BP Use the restroom before checking your BP (a full bladder can raise your pressure) Relax in a comfortable upright chair Feet on the ground Left arm resting comfortably on a flat surface at the level of your heart Legs uncrossed Back supported Sit quietly and don't talk Place the cuff on your bare arm Adjust snuggly, so that only two fingertips can fit between your skin and the top of the cuff Check 2 readings separated by at least one minute Keep a log of your BP readings For a visual, please reference this diagram: http://ccnc.care/bpdiagram  Provider Name: [facility]     Phone: [phone]  Zone 1: ALL CLEAR  Continue to monitor your symptoms:  BP reading is less than 140 (top number) or less than 90 (bottom number)  No right upper stomach pain No headaches or seeing spots No feeling nauseated or throwing up No swelling in face and hands  Zone 2: CAUTION Call your doctor's office for any of the following:  BP  reading is greater than 140 (top number) or greater than 90 (bottom number)  Stomach pain under your ribs in the middle or right side Headaches or seeing spots Feeling nauseated or throwing up Swelling in face and hands  Zone 3: EMERGENCY  Seek immediate medical care if you have any of the following:  BP reading is greater than160 (top number) or greater than 110 (bottom number) Severe headaches not improving with Tylenol Serious difficulty catching your breath Any worsening symptoms from Zone 2    First Trimester of Pregnancy The first trimester of pregnancy is from  week 1 until the end of week 12 (months 1 through 3). A week after a sperm fertilizes an egg, the egg will implant on the wall of the uterus. This embryo will begin to develop into a baby. Genes from you and your partner are forming the baby. The female genes determine whether the baby is a boy or a girl. At 6-8 weeks, the eyes and face are formed, and the heartbeat can be seen on ultrasound. At the end of 12 weeks, all the baby's organs are formed.  Now that you are pregnant, you will want to do everything you can to have a healthy baby. Two of the most important things are to get good prenatal care and to follow your health care provider's instructions. Prenatal care is all the medical care you receive before the baby's birth. This care will help prevent, find, and treat any problems during the pregnancy and childbirth. BODY CHANGES Your body goes through many changes during pregnancy. The changes vary from woman to woman.  You may gain or lose a couple of pounds at first. You may feel sick to your stomach (nauseous) and throw up (vomit). If the vomiting is uncontrollable, call your health care provider. You may tire easily. You may develop headaches that can be relieved by medicines approved by your health care provider. You may urinate more often. Painful urination may mean you have a bladder infection. You may develop heartburn as a result of your pregnancy. You may develop constipation because certain hormones are causing the muscles that push waste through your intestines to slow down. You may develop hemorrhoids or swollen, bulging veins (varicose veins). Your breasts may begin to grow larger and become tender. Your nipples may stick out more, and the tissue that surrounds them (areola) may become darker. Your gums may bleed and may be sensitive to brushing and flossing. Dark spots or blotches (chloasma, mask of pregnancy) may develop on your face. This will likely fade after the baby is  born. Your menstrual periods will stop. You may have a loss of appetite. You may develop cravings for certain kinds of food. You may have changes in your emotions from day to day, such as being excited to be pregnant or being concerned that something may go wrong with the pregnancy and baby. You may have more vivid and strange dreams. You may have changes in your hair. These can include thickening of your hair, rapid growth, and changes in texture. Some women also have hair loss during or after pregnancy, or hair that feels dry or thin. Your hair will most likely return to normal after your baby is born. WHAT TO EXPECT AT YOUR PRENATAL VISITS During a routine prenatal visit: You will be weighed to make sure you and the baby are growing normally. Your blood pressure will be taken. Your abdomen will be measured to track your baby's growth. The fetal heartbeat  will be listened to starting around week 10 or 12 of your pregnancy. Test results from any previous visits will be discussed. Your health care provider may ask you: How you are feeling. If you are feeling the baby move. If you have had any abnormal symptoms, such as leaking fluid, bleeding, severe headaches, or abdominal cramping. If you have any questions. Other tests that may be performed during your first trimester include: Blood tests to find your blood type and to check for the presence of any previous infections. They will also be used to check for low iron levels (anemia) and Rh antibodies. Later in the pregnancy, blood tests for diabetes will be done along with other tests if problems develop. Urine tests to check for infections, diabetes, or protein in the urine. An ultrasound to confirm the proper growth and development of the baby. An amniocentesis to check for possible genetic problems. Fetal screens for spina bifida and Down syndrome. You may need other tests to make sure you and the baby are doing well. HOME CARE  INSTRUCTIONS  Medicines Follow your health care provider's instructions regarding medicine use. Specific medicines may be either safe or unsafe to take during pregnancy. Take your prenatal vitamins as directed. If you develop constipation, try taking a stool softener if your health care provider approves. Diet Eat regular, well-balanced meals. Choose a variety of foods, such as meat or vegetable-based protein, fish, milk and low-fat dairy products, vegetables, fruits, and whole grain breads and cereals. Your health care provider will help you determine the amount of weight gain that is right for you. Avoid raw meat and uncooked cheese. These carry germs that can cause birth defects in the baby. Eating four or five small meals rather than three large meals a day may help relieve nausea and vomiting. If you start to feel nauseous, eating a few soda crackers can be helpful. Drinking liquids between meals instead of during meals also seems to help nausea and vomiting. If you develop constipation, eat more high-fiber foods, such as fresh vegetables or fruit and whole grains. Drink enough fluids to keep your urine clear or pale yellow. Activity and Exercise Exercise only as directed by your health care provider. Exercising will help you: Control your weight. Stay in shape. Be prepared for labor and delivery. Experiencing pain or cramping in the lower abdomen or low back is a good sign that you should stop exercising. Check with your health care provider before continuing normal exercises. Try to avoid standing for long periods of time. Move your legs often if you must stand in one place for a long time. Avoid heavy lifting. Wear low-heeled shoes, and practice good posture. You may continue to have sex unless your health care provider directs you otherwise. Relief of Pain or Discomfort Wear a good support bra for breast tenderness.   Take warm sitz baths to soothe any pain or discomfort caused by  hemorrhoids. Use hemorrhoid cream if your health care provider approves.   Rest with your legs elevated if you have leg cramps or low back pain. If you develop varicose veins in your legs, wear support hose. Elevate your feet for 15 minutes, 3-4 times a day. Limit salt in your diet. Prenatal Care Schedule your prenatal visits by the twelfth week of pregnancy. They are usually scheduled monthly at first, then more often in the last 2 months before delivery. Write down your questions. Take them to your prenatal visits. Keep all your prenatal visits as directed by  your health care provider. Safety Wear your seat belt at all times when driving. Make a list of emergency phone numbers, including numbers for family, friends, the hospital, and police and fire departments. General Tips Ask your health care provider for a referral to a local prenatal education class. Begin classes no later than at the beginning of month 6 of your pregnancy. Ask for help if you have counseling or nutritional needs during pregnancy. Your health care provider can offer advice or refer you to specialists for help with various needs. Do not use hot tubs, steam rooms, or saunas. Do not douche or use tampons or scented sanitary pads. Do not cross your legs for long periods of time. Avoid cat litter boxes and soil used by cats. These carry germs that can cause birth defects in the baby and possibly loss of the fetus by miscarriage or stillbirth. Avoid all smoking, herbs, alcohol, and medicines not prescribed by your health care provider. Chemicals in these affect the formation and growth of the baby. Schedule a dentist appointment. At home, brush your teeth with a soft toothbrush and be gentle when you floss. SEEK MEDICAL CARE IF:  You have dizziness. You have mild pelvic cramps, pelvic pressure, or nagging pain in the abdominal area. You have persistent nausea, vomiting, or diarrhea. You have a bad smelling vaginal  discharge. You have pain with urination. You notice increased swelling in your face, hands, legs, or ankles. SEEK IMMEDIATE MEDICAL CARE IF:  You have a fever. You are leaking fluid from your vagina. You have spotting or bleeding from your vagina. You have severe abdominal cramping or pain. You have rapid weight gain or loss. You vomit blood or material that looks like coffee grounds. You are exposed to [place] measles and have never had them. You are exposed to fifth disease or chickenpox. You develop a severe headache. You have shortness of breath. You have any kind of trauma, such as from a fall or a car accident. Document Released: 10/14/2001 Document Revised: 03/06/2014 Document Reviewed: 08/30/2013 [facility] Patient Information 2015 [place], [place]. This information is not intended to replace advice given to you by your health care provider. Make sure you discuss any questions you have with your health care provider.  ADDITIONAL HEALTHCARE OPTIONS FOR PATIENTS  [place] [facility] / e-Visit: [email or web address]         [facility]: [phone]  [facility]: [phone]                   [facility]: [phone]     Safe Medications in Pregnancy   Acne: Benzoyl Peroxide Salicylic Acid  Backache/Headache: Tylenol: 2 regular strength every 4 hours OR              2 Extra strength every 6 hours  Colds/Coughs/Allergies: Benadryl (alcohol free) 25 mg every 6 hours as needed Breath right strips Claritin Cepacol throat lozenges Chloraseptic throat spray Cold-Eeze- up to three times per day Cough drops, alcohol free Flonase (by prescription only) Guaifenesin Mucinex Robitussin DM (plain only, alcohol free) Saline nasal spray/drops Sudafed (pseudoephedrine) & Actifed ** use only after [redacted] weeks gestation and if you do not have high blood pressure Tylenol Vicks Vaporub Zinc  lozenges Zyrtec   Constipation: Colace Ducolax suppositories Fleet enema Glycerin suppositories Metamucil Milk of magnesia Miralax Senokot Smooth move tea  Diarrhea: Kaopectate Imodium A-D  *NO pepto Bismol  Hemorrhoids: Anusol Anusol HC Preparation H Tucks  Indigestion: Tums Maalox Mylanta  Zantac  Pepcid  Insomnia: Benadryl (alcohol free) 25mg  every 6 hours as needed Tylenol PM Unisom, no Gelcaps  Leg Cramps: Tums MagGel  Nausea/Vomiting:  Bonine Dramamine Emetrol Ginger extract Sea bands Meclizine  Nausea medication to take during pregnancy:  Unisom (doxylamine succinate 25 mg tablets) Take one tablet daily at bedtime. If symptoms are not adequately controlled, the dose can be increased to a maximum recommended dose of two tablets daily (1/2 tablet in the morning, 1/2 tablet mid-afternoon and one at bedtime). Vitamin B6 100mg  tablets. Take one tablet twice a day (up to 200 mg per day).  Skin Rashes: Aveeno products Benadryl cream or 25mg  every 6 hours as needed Calamine Lotion 1% cortisone cream  Yeast infection: Gyne-lotrimin 7 Monistat 7   **If taking multiple medications, please check labels to avoid duplicating the same active ingredients **take medication as directed on the label ** Do not exceed 4000 mg of tylenol in 24 hours **Do not take medications that contain aspirin or ibuprofen   Considering Waterbirth? Guide for patients at [facility] [employer] Why consider waterbirth? Gentle birth for babies  Less pain medicine used in labor  May allow for passive descent/less pushing  May reduce perineal tears  More mobility and instinctive maternal position changes  Increased maternal relaxation  Reduced blood pressure in labor   Is waterbirth safe? What are the risks of infection, drowning or other complications? Infection:  Very low risk (3.7 % for tub vs 4.8% for bed)  7 in 8000 waterbirths with documented infection   Poorly cleaned equipment most common cause  Slightly lower group B strep transmission rate  Drowning  Maternal:  Very low risk  Related to seizures or fainting  Newborn:  Very low risk. No evidence of increased risk of respiratory problems in multiple large studies  Physiological protection from breathing under water  Avoid underwater birth if there are any fetal complications  Once baby's head is out of the water, keep it out.  Birth complication  Some reports of cord trauma, but risk decreased by bringing baby to surface gradually  No evidence of increased risk of shoulder dystocia. Mothers can usually change positions faster in water than in a bed, possibly aiding the maneuvers to free the shoulder.  ? You must attend a Waterbirth class at [employer] at [facility] 3rd Wednesday of every month from 7-9pm  [employer] by calling [phone] or online at [email or web address]  Bring [place] the certificate from the class to your prenatal appointment  Meet with a midwife at 36 weeks to see if you can still plan a waterbirth and to sign the consent.   If you plan a waterbirth at [employer] and [facility], the following purchases are optional: Copywriter, advertising top  [employer]   Things that would prevent you from having a waterbirth: Unknown or Positive COVID-19 diagnosis upon admission to hospital  Premature, <37wks  Previous cesarean birth  Presence of thick meconium-stained fluid  Multiple gestation (Twins, triplets, etc.)  Uncontrolled diabetes or gestational diabetes requiring medication  Hypertension diagnosed in pregnancy or preexisting hypertension (gestational hypertension, preeclampsia, or chronic hypertension) Heavy vaginal bleeding  Non-reassuring fetal heart rate  Active infection (MRSA, etc.). Group B Strep is NOT a contraindication for waterbirth.  If your labor has to be induced and induction method requires  continuous monitoring of the baby's heart rate  Other risks/issues identified by your obstetrical provider   Please remember  that birth is unpredictable. Under certain unforeseeable circumstances your provider may advise against giving birth in the tub. These decisions will be made on a case-by-case basis and with the safety of you and your baby as our highest priority.  **Please remember that in order to have a waterbirth, you must test Negative to COVID-19 upon admission to the hospital.**

## 2023-04-02 NOTE — Progress Notes (Signed)
[place] 12+6 wks,measurements c/w dates,FHR 159 bpm,CRL 68.93 mm,NB present,NT 2.1 mm,normal ovaries

## 2023-04-03 LAB — HCV INTERPRETATION

## 2023-04-03 LAB — CBC/D/PLT+RPR+RH+ABO+RUBIGG...
Antibody Screen: NEGATIVE
HCV Ab: NONREACTIVE
Immature Grans (Abs): 0 10*3/uL (ref 0.0–0.1)
Monocytes Absolute: 0.4 10*3/uL (ref 0.1–0.9)
Neutrophils Absolute: 5 10*3/uL (ref 1.4–7.0)
Neutrophils: 69 %
RBC: 4.42 x10E6/uL (ref 3.77–5.28)
RDW: 15.5 % — ABNORMAL HIGH (ref 11.7–15.4)
WBC: 7.4 10*3/uL (ref 3.4–10.8)

## 2023-04-03 LAB — COMPREHENSIVE METABOLIC PANEL

## 2023-04-03 NOTE — Addendum Note (Signed)
Addended by: Moss Mc on: 04/03/2023 12:50 PM   Modules accepted: Orders

## 2023-04-04 LAB — CBC/D/PLT+RPR+RH+ABO+RUBIGG...
Basophils Absolute: 0 10*3/uL (ref 0.0–0.2)
Basos: 0 %
EOS (ABSOLUTE): 0.3 10*3/uL (ref 0.0–0.4)
Eos: 4 %
HIV Screen 4th Generation wRfx: NONREACTIVE
Hematocrit: 36.4 % (ref 34.0–46.6)
Hemoglobin: 11.9 g/dL (ref 11.1–15.9)
Hepatitis B Surface Ag: NEGATIVE
Immature Granulocytes: 0 %
Lymphocytes Absolute: 1.7 10*3/uL (ref 0.7–3.1)
Lymphs: 22 %
MCH: 26.9 pg (ref 26.6–33.0)
MCHC: 32.7 g/dL (ref 31.5–35.7)
MCV: 82 fL (ref 79–97)
Monocytes: 5 %
Platelets: 249 10*3/uL (ref 150–450)
RPR Ser Ql: NONREACTIVE
Rh Factor: POSITIVE
Rubella Antibodies, IGG: <0.9 index — ABNORMAL LOW (ref >0.99)

## 2023-04-04 LAB — INTEGRATED 1
Crown Rump Length: 68.9 mm
Gest. Age on Collection Date: 13 weeks
Maternal Age at EDD: 24.6 yr
Nuchal Translucency (NT): 2.1 mm
Number of Fetuses: 1
PAPP-A Value: 1214.1 ng/mL
Weight: 155 [lb_av]

## 2023-04-04 LAB — PROTEIN / CREATININE RATIO, URINE
Creatinine, Urine: 114.7 mg/dL
Protein, Ur: 8.4 mg/dL
Protein/Creat Ratio: 73 mg/g creat (ref 0–200)

## 2023-04-04 LAB — COMPREHENSIVE METABOLIC PANEL
AST: 12 IU/L (ref 0–40)
Albumin/Globulin Ratio: 1.6 (ref 1.2–2.2)
Alkaline Phosphatase: 81 IU/L (ref 44–121)
BUN: 8 mg/dL (ref 6–20)
CO2: 21 mmol/L (ref 20–29)
Chloride: 101 mmol/L (ref 96–106)
Globulin, Total: 2.9 g/dL (ref 1.5–4.5)
Potassium: 4.1 mmol/L (ref 3.5–5.2)

## 2023-04-04 LAB — URINE CULTURE

## 2023-04-04 LAB — GC/CHLAMYDIA PROBE AMP
Chlamydia trachomatis, NAA: NEGATIVE
Neisseria Gonorrhoeae by PCR: NEGATIVE

## 2023-04-06 ENCOUNTER — Encounter: Payer: Self-pay | Admitting: Women's Health

## 2023-04-06 DIAGNOSIS — Z2839 Other underimmunization status: Secondary | ICD-10-CM | POA: Insufficient documentation

## 2023-04-14 LAB — PANORAMA PRENATAL TEST FULL PANEL:PANORAMA TEST PLUS 5 ADDITIONAL MICRODELETIONS
22Q11.2 DELETION SYNDROME RESULT TEXT: HIGH — AB
FETAL FRACTION: 7.6

## 2023-04-27 ENCOUNTER — Encounter: Payer: Medicaid Other | Admitting: Women's Health

## 2023-05-01 ENCOUNTER — Encounter: Payer: Self-pay | Admitting: Obstetrics and Gynecology

## 2023-05-01 ENCOUNTER — Ambulatory Visit (INDEPENDENT_AMBULATORY_CARE_PROVIDER_SITE_OTHER): Payer: Medicaid Other | Admitting: Obstetrics and Gynecology

## 2023-05-01 VITALS — BP 109/60 | HR 60 | Wt 160.0 lb

## 2023-05-01 DIAGNOSIS — Z3A17 17 weeks gestation of pregnancy: Secondary | ICD-10-CM

## 2023-05-01 DIAGNOSIS — O09299 Supervision of pregnancy with other poor reproductive or obstetric history, unspecified trimester: Secondary | ICD-10-CM

## 2023-05-01 DIAGNOSIS — O09292 Supervision of pregnancy with other poor reproductive or obstetric history, second trimester: Secondary | ICD-10-CM

## 2023-05-01 DIAGNOSIS — O09892 Supervision of other high risk pregnancies, second trimester: Secondary | ICD-10-CM

## 2023-05-01 DIAGNOSIS — Z3480 Encounter for supervision of other normal pregnancy, unspecified trimester: Secondary | ICD-10-CM

## 2023-05-01 DIAGNOSIS — Z2839 Other underimmunization status: Secondary | ICD-10-CM

## 2023-05-01 NOTE — Progress Notes (Signed)
Subjective:  Shelby Acevedo is a 22 y.o. [id number] at [redacted]w[redacted]d being seen today for ongoing prenatal care.  She is currently monitored for the following issues for this low-risk pregnancy and has History of pre-eclampsia in prior pregnancy, currently pregnant; Encounter for supervision of normal pregnancy, antepartum; and Rubella non-immune status, antepartum on their problem list.  Patient reports no complaints.  Contractions: Not present.  .  Movement: Present. Denies leaking of fluid.   The following portions of the patient's history were reviewed and updated as appropriate: allergies, current medications, past family history, past medical history, past social history, past surgical history and problem list. Problem list updated.  Objective:   Vitals:   05/01/23 1111  BP: 109/60  Pulse: 60  Weight: 160 lb (72.6 kg)    Fetal Status:     Movement: Present     General:  Alert, oriented and cooperative. Patient is in no acute distress.  Skin: Skin is warm and dry. No rash noted.   Cardiovascular: Normal heart rate noted  Respiratory: Normal respiratory effort, no problems with respiration noted  Abdomen: Soft, gravid, appropriate for gestational age. Pain/Pressure: Absent     Pelvic:  Cervical exam deferred        Extremities: Normal range of motion.     Mental Status: Normal mood and affect. Normal behavior. Normal judgment and thought content.   Urinalysis:      Assessment and Plan:  Pregnancy: [id number] at [redacted]w[redacted]d  1. Supervision of other normal pregnancy, antepartum Stable Anatomy scan scheduled  2. History of pre-eclampsia in prior pregnancy, currently pregnant No S/Sx at present Continue with every day BASA  3. Rubella non-immune status, antepartum Vaccine PP  Preterm labor symptoms and general obstetric precautions including but not limited to vaginal bleeding, contractions, leaking of fluid and fetal movement were reviewed in detail with the patient. Please refer to After  Visit Summary for other counseling recommendations.  Return in about 4 weeks (around 05/29/2023) for OB visit, face to face, any provider.   Hermina Staggers, MD

## 2023-05-14 ENCOUNTER — Ambulatory Visit (INDEPENDENT_AMBULATORY_CARE_PROVIDER_SITE_OTHER): Payer: Medicaid Other

## 2023-05-14 ENCOUNTER — Ambulatory Visit (INDEPENDENT_AMBULATORY_CARE_PROVIDER_SITE_OTHER): Payer: Medicaid Other | Admitting: Advanced Practice Midwife

## 2023-05-14 VITALS — BP 119/75 | HR 86 | Wt 162.0 lb

## 2023-05-14 DIAGNOSIS — Z3480 Encounter for supervision of other normal pregnancy, unspecified trimester: Secondary | ICD-10-CM

## 2023-05-14 DIAGNOSIS — Z3A18 18 weeks gestation of pregnancy: Secondary | ICD-10-CM | POA: Diagnosis not present

## 2023-05-14 DIAGNOSIS — Z363 Encounter for antenatal screening for malformations: Secondary | ICD-10-CM

## 2023-05-14 DIAGNOSIS — Z3A12 12 weeks gestation of pregnancy: Secondary | ICD-10-CM

## 2023-05-14 DIAGNOSIS — Z3481 Encounter for supervision of other normal pregnancy, first trimester: Secondary | ICD-10-CM

## 2023-05-14 MED ORDER — BLOOD PRESSURE MONITOR MISC: 0 refills | Status: DC

## 2023-05-14 NOTE — Progress Notes (Signed)
   LOW-RISK PREGNANCY VISIT Patient name: Shelby Acevedo MRN [id number]  Date of birth: 11/29/2000 Chief Complaint:   Routine Prenatal Visit  History of Present Illness:   Shelby Acevedo is a 22 y.o. [id number] female at [redacted]w[redacted]d with an Estimated Date of Delivery: 10/09/23 being seen today for ongoing management of a low-risk pregnancy.  Today she reports no complaints. Contractions: Not present.  .  Movement: Present. denies leaking of fluid. Review of Systems:   Pertinent items are noted in HPI Denies abnormal vaginal discharge w/ itching/odor/irritation, headaches, visual changes, shortness of breath, chest pain, abdominal pain, severe nausea/vomiting, or problems with urination or bowel movements unless otherwise stated above. Pertinent History Reviewed:  Reviewed past medical,surgical, social, obstetrical and family history.  Reviewed problem list, medications and allergies. Physical Assessment:   Vitals:   05/14/23 1442  BP: 119/75  Pulse: 86  Weight: 162 lb (73.5 kg)  Body mass index is 27.81 kg/m.        Physical Examination:   General appearance: Well appearing, and in no distress  Mental status: Alert, oriented to person, place, and time  Skin: Warm & dry  Cardiovascular: Normal heart rate noted  Respiratory: Normal respiratory effort, no distress  Abdomen: Soft, gravid, nontender  Pelvic: Cervical exam deferred         Extremities: Edema: None  Fetal Status:     Movement: Present   [place] 18+6 wks,cephalic,anterior/fundal placenta gr 0,normal ovaries,CX 3.2 cm,SVP of fluid 5.5 cm,FHR 157 bpm,EFW 299 g 83%,anatomy complete,no obvious abnormalities  Chaperone:  N/A    No results found for this or any previous visit (from the past 24 hour(s)).  Assessment & Plan:    Pregnancy: [id number] at [redacted]w[redacted]d 1. Supervision of other normal pregnancy, antepartum  - INTEGRATED 2  2. [redacted] weeks gestation of pregnancy  - INTEGRATED 2  Pt has talked w/genetic counselors at [employer] or further testing.       Meds: No orders of the defined types were placed in this encounter.  Labs/procedures today: anatomy scan, 2nd IT  Plan:  Continue routine obstetrical care  Next visit: prefers in person    Reviewed:  general obstetric precautions including but not limited to vaginal bleeding, contractions, leaking of fluid and fetal movement were reviewed in detail with the patient.  All questions were answered. Has home bp cuff.. Check bp weekly, let us know if >140/90.   Follow-up: Return in about 4 weeks (around 06/11/2023) for LROB(can cancel 7/25 visit).  Future Appointments  Date Time Provider Department Center  05/28/2023  1:30 PM Jacklyn Shell, CNM [facility]    Orders Placed This Encounter  Procedures   INTEGRATED 2   Jacklyn Shell DNP, CNM 05/14/2023 2:56 PM

## 2023-05-14 NOTE — Progress Notes (Signed)
[place] 18+6 wks,cephalic,anterior/fundal placenta gr 0,normal ovaries,CX 3.2 cm,SVP of fluid 5.5 cm,FHR 157 bpm,EFW 299 g 83%,anatomy complete,no obvious abnormalities

## 2023-05-14 NOTE — Patient Instructions (Signed)
Gardiner Rhyme, I greatly value your feedback.  If you receive a survey following your visit with [place] today, we appreciate you taking the time to fill it out.  Thanks, [name], CNM     [facility] HAS MOVED!!! It is now [facility] at [facility] ([address] [place], [place] [address]) Entrance located off of [address] Free 24/7 valet parking   Go to [email or web address] to register for FREE online childbirth classes    Second Trimester of Pregnancy The second trimester is from week 14 through week 27 (months 4 through 6). The second trimester is often a time when you feel your best. Your body has adjusted to being pregnant, and you begin to feel better physically. Usually, morning sickness has lessened or quit completely, you may have more energy, and you may have an increase in appetite. The second trimester is also a time when the fetus is growing rapidly. At the end of the sixth month, the fetus is about 9 inches long and weighs about 1 pounds. You will likely begin to feel the baby move (quickening) between 16 and 20 weeks of pregnancy. Body changes during your second trimester Your body continues to go through many changes during your second trimester. The changes vary from woman to woman. Your weight will continue to increase. You will notice your lower abdomen bulging out. You may begin to get stretch marks on your hips, abdomen, and breasts. You may develop headaches that can be relieved by medicines. The medicines should be approved by your health care provider. You may urinate more often because the fetus is pressing on your bladder. You may develop or continue to have heartburn as a result of your pregnancy. You may develop constipation because certain hormones are causing the muscles that push waste through your intestines to slow down. You may develop hemorrhoids or swollen, bulging veins (varicose veins). You may have back pain. This is  caused by: Weight gain. Pregnancy hormones that are relaxing the joints in your pelvis. A shift in weight and the muscles that support your balance. Your breasts will continue to grow and they will continue to become tender. Your gums may bleed and may be sensitive to brushing and flossing. Dark spots or blotches (chloasma, mask of pregnancy) may develop on your face. This will likely fade after the baby is born. A dark line from your belly button to the pubic area (linea nigra) may appear. This will likely fade after the baby is born. You may have changes in your hair. These can include thickening of your hair, rapid growth, and changes in texture. Some women also have hair loss during or after pregnancy, or hair that feels dry or thin. Your hair will most likely return to normal after your baby is born.  What to expect at prenatal visits During a routine prenatal visit: You will be weighed to make sure you and the fetus are growing normally. Your blood pressure will be taken. Your abdomen will be measured to track your baby's growth. The fetal heartbeat will be listened to. Any test results from the previous visit will be discussed.  Your health care provider may ask you: How you are feeling. If you are feeling the baby move. If you have had any abnormal symptoms, such as leaking fluid, bleeding, severe headaches, or abdominal cramping. If you are using any tobacco products, including cigarettes, chewing tobacco, and electronic cigarettes. If you have any questions.  Other tests that  may be performed during your second trimester include: Blood tests that check for: Low iron levels (anemia). High blood sugar that affects pregnant women (gestational diabetes) between 19 and 28 weeks. Rh antibodies. This is to check for a protein on red blood cells (Rh factor). Urine tests to check for infections, diabetes, or protein in the urine. An ultrasound to confirm the proper growth and  development of the baby. An amniocentesis to check for possible genetic problems. Fetal screens for spina bifida and Down syndrome. HIV (human immunodeficiency virus) testing. Routine prenatal testing includes screening for HIV, unless you choose not to have this test.  Follow these instructions at home: Medicines Follow your health care provider's instructions regarding medicine use. Specific medicines may be either safe or unsafe to take during pregnancy. Take a prenatal vitamin that contains at least 600 micrograms (mcg) of folic acid. If you develop constipation, try taking a stool softener if your health care provider approves. Eating and drinking Eat a balanced diet that includes fresh fruits and vegetables, whole grains, good sources of protein such as meat, eggs, or tofu, and low-fat dairy. Your health care provider will help you determine the amount of weight gain that is right for you. Avoid raw meat and uncooked cheese. These carry germs that can cause birth defects in the baby. If you have low calcium intake from food, talk to your health care provider about whether you should take a daily calcium supplement. Limit foods that are high in fat and processed sugars, such as fried and sweet foods. To prevent constipation: Drink enough fluid to keep your urine clear or pale yellow. Eat foods that are high in fiber, such as fresh fruits and vegetables, whole grains, and beans. Activity Exercise only as directed by your health care provider. Most women can continue their usual exercise routine during pregnancy. Try to exercise for 30 minutes at least 5 days a week. Stop exercising if you experience uterine contractions. Avoid heavy lifting, wear low heel shoes, and practice good posture. A sexual relationship may be continued unless your health care provider directs you otherwise. Relieving pain and discomfort Wear a good support bra to prevent discomfort from breast tenderness. Take  warm sitz baths to soothe any pain or discomfort caused by hemorrhoids. Use hemorrhoid cream if your health care provider approves. Rest with your legs elevated if you have leg cramps or low back pain. If you develop varicose veins, wear support hose. Elevate your feet for 15 minutes, 3-4 times a day. Limit salt in your diet. Prenatal Care Write down your questions. Take them to your prenatal visits. Keep all your prenatal visits as told by your health care provider. This is important. Safety Wear your seat belt at all times when driving. Make a list of emergency phone numbers, including numbers for family, friends, the hospital, and police and fire departments. General instructions Ask your health care provider for a referral to a local prenatal education class. Begin classes no later than the beginning of month 6 of your pregnancy. Ask for help if you have counseling or nutritional needs during pregnancy. Your health care provider can offer advice or refer you to specialists for help with various needs. Do not use hot tubs, steam rooms, or saunas. Do not douche or use tampons or scented sanitary pads. Do not cross your legs for long periods of time. Avoid cat litter boxes and soil used by cats. These carry germs that can cause birth defects in the baby and  possibly loss of the fetus by miscarriage or stillbirth. Avoid all smoking, herbs, alcohol, and unprescribed drugs. Chemicals in these products can affect the formation and growth of the baby. Do not use any products that contain nicotine or tobacco, such as cigarettes and e-cigarettes. If you need help quitting, ask your health care provider. Visit your dentist if you have not gone yet during your pregnancy. Use a soft toothbrush to brush your teeth and be gentle when you floss. Contact a health care provider if: You have dizziness. You have mild pelvic cramps, pelvic pressure, or nagging pain in the abdominal area. You have persistent  nausea, vomiting, or diarrhea. You have a bad smelling vaginal discharge. You have pain when you urinate. Get help right away if: You have a fever. You are leaking fluid from your vagina. You have spotting or bleeding from your vagina. You have severe abdominal cramping or pain. You have rapid weight gain or weight loss. You have shortness of breath with chest pain. You notice sudden or extreme swelling of your face, hands, ankles, feet, or legs. You have not felt your baby move in over an hour. You have severe headaches that do not go away when you take medicine. You have vision changes. Summary The second trimester is from week 14 through week 27 (months 4 through 6). It is also a time when the fetus is growing rapidly. Your body goes through many changes during pregnancy. The changes vary from woman to woman. Avoid all smoking, herbs, alcohol, and unprescribed drugs. These chemicals affect the formation and growth your baby. Do not use any tobacco products, such as cigarettes, chewing tobacco, and e-cigarettes. If you need help quitting, ask your health care provider. Contact your health care provider if you have any questions. Keep all prenatal visits as told by your health care provider. This is important. This information is not intended to replace advice given to you by your health care provider. Make sure you discuss any questions you have with your health care provider.

## 2023-05-16 LAB — INTEGRATED 2
AFP MoM: 0.83
Alpha-Fetoprotein: 38.8 ng/mL
Crown Rump Length: 68.9 mm
DIA MoM: 0.87
DIA Value: 139 pg/mL
Estriol, Unconjugated: 2.16 ng/mL
Gest. Age on Collection Date: 13 weeks
Gestational Age: 19 weeks
Maternal Age at EDD: 24.6 yr
Nuchal Translucency (NT): 2.1 mm
Nuchal Translucency MoM: 1.35
Number of Fetuses: 1
PAPP-A MoM: 1.04
PAPP-A Value: 1214.1 ng/mL
Test Results:: NEGATIVE
Weight: 155 [lb_av]
Weight: 155 [lb_av]
hCG MoM: 1.05
hCG Value: 22.3 IU/mL
uE3 MoM: 1.19

## 2023-05-28 ENCOUNTER — Encounter: Payer: Medicaid Other | Admitting: Advanced Practice Midwife

## 2023-06-11 ENCOUNTER — Ambulatory Visit (INDEPENDENT_AMBULATORY_CARE_PROVIDER_SITE_OTHER): Payer: Medicaid Other | Admitting: Obstetrics & Gynecology

## 2023-06-11 ENCOUNTER — Encounter: Payer: Self-pay | Admitting: Obstetrics & Gynecology

## 2023-06-11 VITALS — BP 100/60 | HR 71 | Wt 167.4 lb

## 2023-06-11 DIAGNOSIS — Z3480 Encounter for supervision of other normal pregnancy, unspecified trimester: Secondary | ICD-10-CM

## 2023-06-11 NOTE — Progress Notes (Signed)
   LOW-RISK PREGNANCY VISIT Patient name: Shelby Acevedo MRN [id number]  Date of birth: 2001-08-09 Chief Complaint:   Routine Prenatal Visit  History of Present Illness:   Shelby Acevedo is a 22 y.o. [id number] female at [redacted]w[redacted]d with an Estimated Date of Delivery: 10/09/23 being seen today for ongoing management of a low-risk pregnancy.      04/02/2023   11:08 AM 02/05/2023   10:40 AM 09/06/2020    2:47 PM  Depression screen PHQ 2/9  Decreased Interest 0 0 0  Down, Depressed, Hopeless 0 0 1  PHQ - 2 Score 0 0 1  Altered sleeping 0 1 0  Tired, decreased energy 2 1 0  Change in appetite 0 1 0  Feeling bad or failure about yourself  0 0 0  Trouble concentrating 0 0 0  Moving slowly or fidgety/restless 0 0 0  Suicidal thoughts 0 0 0  PHQ-9 Score 2 3 1   Difficult doing work/chores   Not difficult at all    Today she reports no complaints. Contractions: Not present. Vag. Bleeding: None.  Movement: Present. denies leaking of fluid. Review of Systems:   Pertinent items are noted in HPI Denies abnormal vaginal discharge w/ itching/odor/irritation, headaches, visual changes, shortness of breath, chest pain, abdominal pain, severe nausea/vomiting, or problems with urination or bowel movements unless otherwise stated above. Pertinent History Reviewed:  Reviewed past medical,surgical, social, obstetrical and family history.  Reviewed problem list, medications and allergies.  Physical Assessment:   Vitals:   06/11/23 1351  BP: 100/60  Pulse: 71  Weight: 167 lb 6.4 oz (75.9 kg)  Body mass index is 28.73 kg/m.        Physical Examination:   General appearance: Well appearing, and in no distress  Mental status: Alert, oriented to person, place, and time  Skin: Warm & dry  Respiratory: Normal respiratory effort, no distress  Abdomen: Soft, gravid, nontender  Pelvic: Cervical exam deferred         Extremities:  no edema  Psych:  mood and affect appropriate  Fetal Status: Fetal Heart Rate  (bpm): 155 Fundal Height: 22 cm Movement: Present    Chaperone: n/a    No results found for this or any previous visit (from the past 24 hour(s)).   Assessment & Plan:  1) Low-risk pregnancy [id number] at [redacted]w[redacted]d with an Estimated Date of Delivery: 10/09/23   -discussed contraception- considering Nexplanon   Meds: No orders of the defined types were placed in this encounter.  Labs/procedures today: doppler, PN-2 next visit  Plan:  Continue routine obstetrical care  Next visit: prefers in person    Reviewed: Preterm labor symptoms and general obstetric precautions including but not limited to vaginal bleeding, contractions, leaking of fluid and fetal movement were reviewed in detail with the patient.  All questions were answered.   Follow-up: Return in about 4 weeks (around 07/09/2023) for LROB visit and PN-2.  No orders of the defined types were placed in this encounter.   Myna Hidalgo, DO Attending Obstetrician & Gynecologist, [facility] for [employer], [facility]

## 2023-07-09 ENCOUNTER — Ambulatory Visit (INDEPENDENT_AMBULATORY_CARE_PROVIDER_SITE_OTHER): Payer: Medicaid Other | Admitting: Obstetrics and Gynecology

## 2023-07-09 ENCOUNTER — Encounter: Payer: Self-pay | Admitting: Obstetrics and Gynecology

## 2023-07-09 ENCOUNTER — Other Ambulatory Visit: Payer: Medicaid Other

## 2023-07-09 VITALS — BP 116/80 | HR 74 | Wt 175.5 lb

## 2023-07-09 DIAGNOSIS — Z3480 Encounter for supervision of other normal pregnancy, unspecified trimester: Secondary | ICD-10-CM

## 2023-07-09 DIAGNOSIS — Z131 Encounter for screening for diabetes mellitus: Secondary | ICD-10-CM

## 2023-07-09 DIAGNOSIS — Z3A26 26 weeks gestation of pregnancy: Secondary | ICD-10-CM

## 2023-07-09 DIAGNOSIS — O09299 Supervision of pregnancy with other poor reproductive or obstetric history, unspecified trimester: Secondary | ICD-10-CM

## 2023-07-09 DIAGNOSIS — O09892 Supervision of other high risk pregnancies, second trimester: Secondary | ICD-10-CM

## 2023-07-09 DIAGNOSIS — O09292 Supervision of pregnancy with other poor reproductive or obstetric history, second trimester: Secondary | ICD-10-CM

## 2023-07-09 DIAGNOSIS — O09899 Supervision of other high risk pregnancies, unspecified trimester: Secondary | ICD-10-CM

## 2023-07-09 DIAGNOSIS — Z2839 Other underimmunization status: Secondary | ICD-10-CM

## 2023-07-09 NOTE — Progress Notes (Signed)
   PRENATAL VISIT NOTE  Subjective:  Shelby Acevedo is a 22 y.o. [id number] at [redacted]w[redacted]d being seen today for ongoing prenatal care.  She is currently monitored for the following issues for this low-risk pregnancy and has History of pre-eclampsia in prior pregnancy, currently pregnant; Encounter for supervision of normal pregnancy, antepartum; and Rubella non-immune status, antepartum on their problem list.  Patient reports no complaints.  Contractions: Not present. Vag. Bleeding: None.  Movement: Present. Denies leaking of fluid.   The following portions of the patient's history were reviewed and updated as appropriate: allergies, current medications, past family history, past medical history, past social history, past surgical history and problem list.   Objective:   Vitals:   07/09/23 0838  BP: 116/80  Pulse: 74  Weight: 175 lb 8 oz (79.6 kg)    Fetal Status: Fetal Heart Rate (bpm): 145 Fundal Height: 26 cm Movement: Present     General:  Alert, oriented and cooperative. Patient is in no acute distress.  Skin: Skin is warm and dry. No rash noted.   Cardiovascular: Normal heart rate noted  Respiratory: Normal respiratory effort, no problems with respiration noted  Abdomen: Soft, gravid, appropriate for gestational age.  Pain/Pressure: Absent     Pelvic: Cervical exam deferred        Extremities: Normal range of motion.  Edema: Trace  Mental Status: Normal mood and affect. Normal behavior. Normal judgment and thought content.   Assessment and Plan:  Pregnancy: [id number] at [redacted]w[redacted]d 1. Supervision of other normal pregnancy, antepartum BP and FHR normal   2. Rubella non-immune status, antepartum MMR pp  3. History of pre-eclampsia in prior pregnancy, currently pregnant Normotensive today, continue ASA  4. [redacted] weeks gestation of pregnancy GTT and labs today  Preterm labor symptoms and general obstetric precautions including but not limited to vaginal bleeding, contractions, leaking of  fluid and fetal movement were reviewed in detail with the patient. Please refer to After Visit Summary for other counseling recommendations.   Return in two weeks for routine prenatal    Albertine Grates, FNP

## 2023-07-10 LAB — CBC
Hematocrit: 32.1 % — ABNORMAL LOW (ref 34.0–46.6)
Hemoglobin: 10.7 g/dL — ABNORMAL LOW (ref 11.1–15.9)
MCH: 29.6 pg (ref 26.6–33.0)
MCHC: 33.3 g/dL (ref 31.5–35.7)
MCV: 89 fL (ref 79–97)
Platelets: 218 10*3/uL (ref 150–450)
RBC: 3.62 x10E6/uL — ABNORMAL LOW (ref 3.77–5.28)
RDW: 13.1 % (ref 11.7–15.4)
WBC: 12.4 10*3/uL — ABNORMAL HIGH (ref 3.4–10.8)

## 2023-07-10 LAB — GLUCOSE TOLERANCE, 2 HOURS W/ 1HR
Glucose, 1 hour: 131 mg/dL (ref 70–179)
Glucose, 2 hour: 84 mg/dL (ref 70–152)
Glucose, Fasting: 82 mg/dL (ref 70–91)

## 2023-07-10 LAB — HIV ANTIBODY (ROUTINE TESTING W REFLEX): HIV Screen 4th Generation wRfx: NONREACTIVE

## 2023-07-10 LAB — ANTIBODY SCREEN: Antibody Screen: NEGATIVE

## 2023-07-10 LAB — RPR: RPR Ser Ql: NONREACTIVE

## 2023-07-15 ENCOUNTER — Other Ambulatory Visit: Payer: Self-pay | Admitting: Obstetrics and Gynecology

## 2023-07-15 MED ORDER — FERROUS GLUCONATE 324 (38 FE) MG PO TABS
324.0000 mg | ORAL_TABLET | Freq: Every day | ORAL | 3 refills | Status: DC
Start: 2023-07-15 — End: 2023-10-16

## 2023-07-23 ENCOUNTER — Encounter: Payer: Self-pay | Admitting: Women's Health

## 2023-07-23 ENCOUNTER — Ambulatory Visit: Payer: Medicaid Other | Admitting: Women's Health

## 2023-07-23 VITALS — BP 107/73 | HR 82 | Wt 177.0 lb

## 2023-07-23 DIAGNOSIS — Z3480 Encounter for supervision of other normal pregnancy, unspecified trimester: Secondary | ICD-10-CM

## 2023-07-23 DIAGNOSIS — Z23 Encounter for immunization: Secondary | ICD-10-CM

## 2023-07-23 DIAGNOSIS — Z3483 Encounter for supervision of other normal pregnancy, third trimester: Secondary | ICD-10-CM

## 2023-07-23 DIAGNOSIS — Z3A28 28 weeks gestation of pregnancy: Secondary | ICD-10-CM

## 2023-07-23 NOTE — Patient Instructions (Signed)
Nahlia, thank you for choosing our office today! We appreciate the opportunity to meet your healthcare needs. You may receive a short survey by mail, e-mail, or through [employer]. If you are happy with your care we would appreciate if you could take just a few minutes to complete the survey questions. We read all of your comments and take your feedback very seriously. Thank you again for choosing our office.  Center for [employer] Team at [facility]  [facility] at [facility] ([address] [place], [place] [address]) Entrance C, located off of [address] Free 24/7 valet parking   CLASSES: Go to [email or web address] to register for classes (childbirth, breastfeeding, waterbirth, infant CPR, daddy bootcamp, etc.)  Call the office [phone]) or go to [facility] if: You begin to have strong, frequent contractions Your water breaks.  Sometimes it is a big gush of fluid, sometimes it is just a trickle that keeps getting your panties wet or running down your legs You have vaginal bleeding.  It is normal to have a small amount of spotting if your cervix was checked.  You don't feel your baby moving like normal.  If you don't, get you something to eat and drink and lay down and focus on feeling your baby move.   If your baby is still not moving like normal, you should call the office or go to [facility].  Call the office [phone]) or go to [facility] hospital for these signs of pre-eclampsia: Severe headache that does not go away with Tylenol Visual changes- seeing spots, double, blurred vision Pain under your right breast or upper abdomen that does not go away with Tums or heartburn medicine Nausea and/or vomiting Severe swelling in your hands, feet, and face   Tdap Vaccine It is recommended that you get the Tdap vaccine during the third trimester of EACH pregnancy to help protect your baby from getting pertussis (whooping cough) 27-36 weeks is the BEST time to do  this so that you can pass the protection on to your baby. During pregnancy is better than after pregnancy, but if you are unable to get it during pregnancy it will be offered at the hospital.  You can get this vaccine with [place], at the health department, your family doctor, or some local pharmacies Everyone who will be around your baby should also be up-to-date on their vaccines before the baby comes. Adults (who are not pregnant) only need 1 dose of Tdap during adulthood.   [facility] Pediatricians/Family Doctors [facility]): [address] Colette Ribas, [phone]           [facility]: [address], [phone]                [facility] Medicine [facility]): [address], [phone] (call to ask if accepting patients) [facility] Department: [address], [place], [phone]    [facility] Pediatricians/Family Doctors [facility]): [phone] [address], [phone] [facility]: [address] [place], [phone] [facility]: [address], [phone]  [facility] Doctors  [facility]): [phone] [facility]: [address], [phone]   [facility] Doctors [facility]: [address] [phone]  [facility] Family Doctors  [facility]: [phone], [phone]  Home Blood Pressure Monitoring for Patients   Your provider has recommended that you check your  blood pressure (BP) at least once a week at home. If you do not have a blood pressure cuff at home, one will be provided for you. Contact your provider if you have not received your monitor within 1 week.   Helpful Tips for Accurate Home Blood Pressure Checks  Don't smoke, exercise, or drink caffeine 30 minutes before checking your BP Use the restroom before checking your BP (a full bladder can raise your  pressure) Relax in a comfortable upright chair Feet on the ground Left arm resting comfortably on a flat surface at the level of your heart Legs uncrossed Back supported Sit quietly and don't talk Place the cuff on your bare arm Adjust snuggly, so that only two fingertips can fit between your skin and the top of the cuff Check 2 readings separated by at least one minute Keep a log of your BP readings For a visual, please reference this diagram: http://ccnc.care/bpdiagram  Provider Name: [facility]     Phone: [phone]  Zone 1: ALL CLEAR  Continue to monitor your symptoms:  BP reading is less than 140 (top number) or less than 90 (bottom number)  No right upper stomach pain No headaches or seeing spots No feeling nauseated or throwing up No swelling in face and hands  Zone 2: CAUTION Call your doctor's office for any of the following:  BP reading is greater than 140 (top number) or greater than 90 (bottom number)  Stomach pain under your ribs in the middle or right side Headaches or seeing spots Feeling nauseated or throwing up Swelling in face and hands  Zone 3: EMERGENCY  Seek immediate medical care if you have any of the following:  BP reading is greater than160 (top number) or greater than 110 (bottom number) Severe headaches not improving with Tylenol Serious difficulty catching your breath Any worsening symptoms from Zone 2   Third Trimester of Pregnancy The third trimester is from week 29 through week 42, months 7 through 9. The third trimester is a time when the fetus is growing rapidly. At the end of the ninth month, the fetus is about 20 inches in length and weighs 6-10 pounds.  BODY CHANGES Your body goes through many changes during pregnancy. The changes vary from woman to woman.  Your weight will continue to increase. You can expect to gain 25-35 pounds (11-16 kg) by the end of the pregnancy. You may begin to get stretch marks on your hips, abdomen,  and breasts. You may urinate more often because the fetus is moving lower into your pelvis and pressing on your bladder. You may develop or continue to have heartburn as a result of your pregnancy. You may develop constipation because certain hormones are causing the muscles that push waste through your intestines to slow down. You may develop hemorrhoids or swollen, bulging veins (varicose veins). You may have pelvic pain because of the weight gain and pregnancy hormones relaxing your joints between the bones in your pelvis. Backaches may result from overexertion of the muscles supporting your posture. You may have changes in your hair. These can include thickening of your hair, rapid growth, and changes in texture. Some women also have hair loss during or after pregnancy, or hair that feels dry or thin. Your hair will most likely return to normal after your baby is born. Your breasts will continue to grow and be tender. A yellow discharge may leak from your breasts called colostrum. Your belly button may stick out. You may  feel short of breath because of your expanding uterus. You may notice the fetus "dropping," or moving lower in your abdomen. You may have a bloody mucus discharge. This usually occurs a few days to a week before labor begins. Your cervix becomes thin and soft (effaced) near your due date. WHAT TO EXPECT AT YOUR PRENATAL EXAMS  You will have prenatal exams every 2 weeks until week 36. Then, you will have weekly prenatal exams. During a routine prenatal visit: You will be weighed to make sure you and the fetus are growing normally. Your blood pressure is taken. Your abdomen will be measured to track your baby's growth. The fetal heartbeat will be listened to. Any test results from the previous visit will be discussed. You may have a cervical check near your due date to see if you have effaced. At around 36 weeks, your caregiver will check your cervix. At the same time, your  caregiver will also perform a test on the secretions of the vaginal tissue. This test is to determine if a type of bacteria, Group B streptococcus, is present. Your caregiver will explain this further. Your caregiver may ask you: What your birth plan is. How you are feeling. If you are feeling the baby move. If you have had any abnormal symptoms, such as leaking fluid, bleeding, severe headaches, or abdominal cramping. If you have any questions. Other tests or screenings that may be performed during your third trimester include: Blood tests that check for low iron levels (anemia). Fetal testing to check the health, activity level, and growth of the fetus. Testing is done if you have certain medical conditions or if there are problems during the pregnancy. FALSE LABOR You may feel small, irregular contractions that eventually go away. These are called Braxton Hicks contractions, or false labor. Contractions may last for hours, days, or even weeks before true labor sets in. If contractions come at regular intervals, intensify, or become painful, it is best to be seen by your caregiver.  SIGNS OF LABOR  Menstrual-like cramps. Contractions that are 5 minutes apart or less. Contractions that start on the top of the uterus and spread down to the lower abdomen and back. A sense of increased pelvic pressure or back pain. A watery or bloody mucus discharge that comes from the vagina. If you have any of these signs before the 37th week of pregnancy, call your caregiver right away. You need to go to the hospital to get checked immediately. HOME CARE INSTRUCTIONS  Avoid all smoking, herbs, alcohol, and unprescribed drugs. These chemicals affect the formation and growth of the baby. Follow your caregiver's instructions regarding medicine use. There are medicines that are either safe or unsafe to take during pregnancy. Exercise only as directed by your caregiver. Experiencing uterine cramps is a good sign to  stop exercising. Continue to eat regular, healthy meals. Wear a good support bra for breast tenderness. Do not use hot tubs, steam rooms, or saunas. Wear your seat belt at all times when driving. Avoid raw meat, uncooked cheese, cat litter boxes, and soil used by cats. These carry germs that can cause birth defects in the baby. Take your prenatal vitamins. Try taking a stool softener (if your caregiver approves) if you develop constipation. Eat more high-fiber foods, such as fresh vegetables or fruit and whole grains. Drink plenty of fluids to keep your urine clear or pale yellow. Take warm sitz baths to soothe any pain or discomfort caused by hemorrhoids. Use hemorrhoid cream if  your caregiver approves. If you develop varicose veins, wear support hose. Elevate your feet for 15 minutes, 3-4 times a day. Limit salt in your diet. Avoid heavy lifting, wear low heal shoes, and practice good posture. Rest a lot with your legs elevated if you have leg cramps or low back pain. Visit your dentist if you have not gone during your pregnancy. Use a soft toothbrush to brush your teeth and be gentle when you floss. A sexual relationship may be continued unless your caregiver directs you otherwise. Do not travel far distances unless it is absolutely necessary and only with the approval of your caregiver. Take prenatal classes to understand, practice, and ask questions about the labor and delivery. Make a trial run to the hospital. Pack your hospital bag. Prepare the baby's nursery. Continue to go to all your prenatal visits as directed by your caregiver. SEEK MEDICAL CARE IF: You are unsure if you are in labor or if your water has broken. You have dizziness. You have mild pelvic cramps, pelvic pressure, or nagging pain in your abdominal area. You have persistent nausea, vomiting, or diarrhea. You have a bad smelling vaginal discharge. You have pain with urination. SEEK IMMEDIATE MEDICAL CARE IF:  You  have a fever. You are leaking fluid from your vagina. You have spotting or bleeding from your vagina. You have severe abdominal cramping or pain. You have rapid weight loss or gain. You have shortness of breath with chest pain. You notice sudden or extreme swelling of your face, hands, ankles, feet, or legs. You have not felt your baby move in over an hour. You have severe headaches that do not go away with medicine. You have vision changes. Document Released: 10/14/2001 Document Revised: 10/25/2013 Document Reviewed: 12/21/2012 [facility] Patient Information 2015 [place], [place]. This information is not intended to replace advice given to you by your health care provider. Make sure you discuss any questions you have with your health care provider.

## 2023-07-23 NOTE — Progress Notes (Signed)
LOW-RISK PREGNANCY VISIT Patient name: Shelby Acevedo MRN [id number]  Date of birth: May 04, 2001 Chief Complaint:   Routine Prenatal Visit (Tdap today)  History of Present Illness:   Shelby Acevedo is a 22 y.o. [id number] female at [redacted]w[redacted]d with an Estimated Date of Delivery: 10/09/23 being seen today for ongoing management of a low-risk pregnancy.   Today she reports no complaints. Contractions: Not present. Vag. Bleeding: None.  Movement: Present. denies leaking of fluid.     07/09/2023    9:31 AM 04/02/2023   11:08 AM 02/05/2023   10:40 AM 09/06/2020    2:47 PM  Depression screen PHQ 2/9  Decreased Interest 0 0 0 0  Down, Depressed, Hopeless 0 0 0 1  PHQ - 2 Score 0 0 0 1  Altered sleeping 0 0 1 0  Tired, decreased energy 0 2 1 0  Change in appetite  0 1 0  Feeling bad or failure about yourself  0 0 0 0  Trouble concentrating 0 0 0 0  Moving slowly or fidgety/restless 0 0 0 0  Suicidal thoughts 0 0 0 0  PHQ-9 Score 0 2 3 1   Difficult doing work/chores    Not difficult at all        07/09/2023    9:31 AM 04/02/2023   11:08 AM 02/05/2023   10:40 AM 09/06/2020    2:48 PM  GAD 7 : Generalized Anxiety Score  Nervous, Anxious, on Edge 0 1 1 1   Control/stop worrying 0 0 0 1  Worry too much - different things 0 0 0 0  Trouble relaxing 0 0 1 0  Restless 0 0 0 0  Easily annoyed or irritable 0 0 1 1  Afraid - awful might happen 0 0 0 0  Total GAD 7 Score 0 1 3 3   Anxiety Difficulty    Not difficult at all      Review of Systems:   Pertinent items are noted in HPI Denies abnormal vaginal discharge w/ itching/odor/irritation, headaches, visual changes, shortness of breath, chest pain, abdominal pain, severe nausea/vomiting, or problems with urination or bowel movements unless otherwise stated above. Pertinent History Reviewed:  Reviewed past medical,surgical, social, obstetrical and family history.  Reviewed problem list, medications and allergies. Physical Assessment:   Vitals:    07/23/23 1002  BP: 107/73  Pulse: 82  Weight: 177 lb (80.3 kg)  Body mass index is 30.38 kg/m.        Physical Examination:   General appearance: Well appearing, and in no distress  Mental status: Alert, oriented to person, place, and time  Skin: Warm & dry  Cardiovascular: Normal heart rate noted  Respiratory: Normal respiratory effort, no distress  Abdomen: Soft, gravid, nontender  Pelvic: Cervical exam deferred         Extremities: Edema: Trace  Fetal Status: Fetal Heart Rate (bpm): 150 Fundal Height: 28 cm Movement: Present    Chaperone: N/A   No results found for this or any previous visit (from the past 24 hour(s)).  Assessment & Plan:  1) Low-risk pregnancy [id number] at [redacted]w[redacted]d with an Estimated Date of Delivery: 10/09/23   2) H/O pre-e, ASA   Meds: No orders of the defined types were placed in this encounter.  Labs/procedures today: tdap  Plan:  Continue routine obstetrical care  Next visit: prefers in person    Reviewed: Preterm labor symptoms and general obstetric precautions including but not limited to vaginal bleeding, contractions, leaking of fluid and fetal movement  were reviewed in detail with the patient.  All questions were answered. Does have home bp cuff. Office bp cuff given: not applicable. Check bp weekly, let us know if consistently >140 and/or >90.  Follow-up: Return for As scheduled.  Future Appointments  Date Time Provider Department Center  08/06/2023  9:50 AM Cheral Marker, CNM [facility]  08/20/2023  9:10 AM Cheral Marker, CNM [facility]  09/03/2023  9:50 AM Cresenzo-Dishmon, Scarlette Calico, CNM [facility]    Orders Placed This Encounter  Procedures   Tdap vaccine greater than or equal to 7yo IM   Cheral Marker CNM, [facility] 07/23/2023 10:09 AM

## 2023-08-06 ENCOUNTER — Ambulatory Visit: Payer: Medicaid Other | Admitting: Obstetrics & Gynecology

## 2023-08-06 ENCOUNTER — Encounter: Payer: Self-pay | Admitting: Obstetrics & Gynecology

## 2023-08-06 VITALS — BP 112/74 | HR 73 | Wt 178.0 lb

## 2023-08-06 DIAGNOSIS — Z3A30 30 weeks gestation of pregnancy: Secondary | ICD-10-CM

## 2023-08-06 DIAGNOSIS — Z3483 Encounter for supervision of other normal pregnancy, third trimester: Secondary | ICD-10-CM

## 2023-08-06 DIAGNOSIS — Z3480 Encounter for supervision of other normal pregnancy, unspecified trimester: Secondary | ICD-10-CM

## 2023-08-06 MED ORDER — OMEPRAZOLE 20 MG PO CPDR: 20.0000 mg | DELAYED_RELEASE_CAPSULE | Freq: Every day | ORAL | 6 refills | Status: DC

## 2023-08-06 NOTE — Progress Notes (Signed)
   LOW-RISK PREGNANCY VISIT Patient name: Shelby Acevedo MRN [id number]  Date of birth: 22-Mar-2001 Chief Complaint:   Routine Prenatal Visit (Heart burn getting worse)  History of Present Illness:   Shelby Acevedo is a 22 y.o. [id number] female at [redacted]w[redacted]d with an Estimated Date of Delivery: 10/09/23 being seen today for ongoing management of a low-risk pregnancy.     07/09/2023    9:31 AM 04/02/2023   11:08 AM 02/05/2023   10:40 AM 09/06/2020    2:47 PM  Depression screen PHQ 2/9  Decreased Interest 0 0 0 0  Down, Depressed, Hopeless 0 0 0 1  PHQ - 2 Score 0 0 0 1  Altered sleeping 0 0 1 0  Tired, decreased energy 0 2 1 0  Change in appetite  0 1 0  Feeling bad or failure about yourself  0 0 0 0  Trouble concentrating 0 0 0 0  Moving slowly or fidgety/restless 0 0 0 0  Suicidal thoughts 0 0 0 0  PHQ-9 Score 0 2 3 1   Difficult doing work/chores    Not difficult at all    Today she reports no complaints. Contractions: Not present. Vag. Bleeding: None.  Movement: Present. denies leaking of fluid. Review of Systems:   Pertinent items are noted in HPI Denies abnormal vaginal discharge w/ itching/odor/irritation, headaches, visual changes, shortness of breath, chest pain, abdominal pain, severe nausea/vomiting, or problems with urination or bowel movements unless otherwise stated above. Pertinent History Reviewed:  Reviewed past medical,surgical, social, obstetrical and family history.  Reviewed problem list, medications and allergies. Physical Assessment:   Vitals:   08/06/23 1027  BP: 112/74  Pulse: 73  Weight: 178 lb (80.7 kg)  Body mass index is 30.55 kg/m.        Physical Examination:   General appearance: Well appearing, and in no distress  Mental status: Alert, oriented to person, place, and time  Skin: Warm & dry  Cardiovascular: Normal heart rate noted  Respiratory: Normal respiratory effort, no distress  Abdomen: Soft, gravid, nontender  Pelvic: Cervical exam deferred          Extremities: Edema: None  Fetal Status:     Movement: Present    Chaperone: na  No results found for this or any previous visit (from the past 24 hour(s)).  Assessment & Plan:  1) Low-risk pregnancy [id number] at [redacted]w[redacted]d with an Estimated Date of Delivery: 10/09/23   GERD omeprazole   Meds:  Meds ordered this encounter  Medications   omeprazole (PRILOSEC) 20 MG capsule    Sig: Take 1 capsule (20 mg total) by mouth daily. 1 tablet a day    Dispense:  30 capsule    Refill:  6   Labs/procedures today:   Plan:  Continue routine obstetrical care  Next visit: prefers in person      Follow-up: Return for keep scheduled.  No orders of the defined types were placed in this encounter.   Lazaro Arms, MD 08/06/2023 11:37 AM

## 2023-08-20 ENCOUNTER — Ambulatory Visit (INDEPENDENT_AMBULATORY_CARE_PROVIDER_SITE_OTHER): Payer: Medicaid Other | Admitting: Women's Health

## 2023-08-20 ENCOUNTER — Encounter: Payer: Self-pay | Admitting: Women's Health

## 2023-08-20 VITALS — BP 98/64 | HR 64 | Wt 182.0 lb

## 2023-08-20 DIAGNOSIS — Z3483 Encounter for supervision of other normal pregnancy, third trimester: Secondary | ICD-10-CM

## 2023-08-20 DIAGNOSIS — Z23 Encounter for immunization: Secondary | ICD-10-CM

## 2023-08-20 DIAGNOSIS — Z3A32 32 weeks gestation of pregnancy: Secondary | ICD-10-CM

## 2023-08-20 DIAGNOSIS — Z3480 Encounter for supervision of other normal pregnancy, unspecified trimester: Secondary | ICD-10-CM

## 2023-08-20 NOTE — Progress Notes (Signed)
LOW-RISK PREGNANCY VISIT Patient name: Shelby Acevedo MRN [id number]  Date of birth: 07-20-2001 Chief Complaint:   Routine Prenatal Visit  History of Present Illness:   Shelby Acevedo is a 22 y.o. [id number] female at [redacted]w[redacted]d with an Estimated Date of Delivery: 10/09/23 being seen today for ongoing management of a low-risk pregnancy.   Today she reports  legs ache at night, some pressure . Contractions: Not present.  .  Movement: Present. denies leaking of fluid.     07/09/2023    9:31 AM 04/02/2023   11:08 AM 02/05/2023   10:40 AM 09/06/2020    2:47 PM  Depression screen PHQ 2/9  Decreased Interest 0 0 0 0  Down, Depressed, Hopeless 0 0 0 1  PHQ - 2 Score 0 0 0 1  Altered sleeping 0 0 1 0  Tired, decreased energy 0 2 1 0  Change in appetite  0 1 0  Feeling bad or failure about yourself  0 0 0 0  Trouble concentrating 0 0 0 0  Moving slowly or fidgety/restless 0 0 0 0  Suicidal thoughts 0 0 0 0  PHQ-9 Score 0 2 3 1   Difficult doing work/chores    Not difficult at all        07/09/2023    9:31 AM 04/02/2023   11:08 AM 02/05/2023   10:40 AM 09/06/2020    2:48 PM  GAD 7 : Generalized Anxiety Score  Nervous, Anxious, on Edge 0 1 1 1   Control/stop worrying 0 0 0 1  Worry too much - different things 0 0 0 0  Trouble relaxing 0 0 1 0  Restless 0 0 0 0  Easily annoyed or irritable 0 0 1 1  Afraid - awful might happen 0 0 0 0  Total GAD 7 Score 0 1 3 3   Anxiety Difficulty    Not difficult at all      Review of Systems:   Pertinent items are noted in HPI Denies abnormal vaginal discharge w/ itching/odor/irritation, headaches, visual changes, shortness of breath, chest pain, abdominal pain, severe nausea/vomiting, or problems with urination or bowel movements unless otherwise stated above. Pertinent History Reviewed:  Reviewed past medical,surgical, social, obstetrical and family history.  Reviewed problem list, medications and allergies. Physical Assessment:   Vitals:   08/20/23  0922  BP: 98/64  Pulse: 64  Weight: 182 lb (82.6 kg)  Body mass index is 31.24 kg/m.        Physical Examination:   General appearance: Well appearing, and in no distress  Mental status: Alert, oriented to person, place, and time  Skin: Warm & dry  Cardiovascular: Normal heart rate noted  Respiratory: Normal respiratory effort, no distress  Abdomen: Soft, gravid, nontender  Pelvic: Cervical exam deferred         Extremities: Edema: None  Fetal Status: Fetal Heart Rate (bpm): 142 Fundal Height: 30 cm Movement: Present    Chaperone: N/A   No results found for this or any previous visit (from the past 24 hour(s)).  Assessment & Plan:  1) Low-risk pregnancy [id number] at [redacted]w[redacted]d with an Estimated Date of Delivery: 10/09/23   2) Legs aching at night, making it hard to sleep-can try magnesium lotion, tylenol pm  3) Pressure> discussed tape/belt   Meds: No orders of the defined types were placed in this encounter.  Labs/procedures today: flu shot  Plan:  Continue routine obstetrical care  Next visit: prefers in person    Reviewed: Preterm  labor symptoms and general obstetric precautions including but not limited to vaginal bleeding, contractions, leaking of fluid and fetal movement were reviewed in detail with the patient.  All questions were answered. Does have home bp cuff. Office bp cuff given: not applicable. Check bp weekly, let us know if consistently >140 and/or >90.  Follow-up: Return for As scheduled.  Future Appointments  Date Time Provider Department Center  09/03/2023  9:50 AM Jacklyn Shell, CNM [facility]  09/17/2023  9:50 AM Cheral Marker, CNM [facility]  09/23/2023  9:50 AM Cheral Marker, CNM [facility]  09/30/2023  9:50 AM Sue Lush, FNP [facility]  10/07/2023  9:50 AM Arabella Merles, CNM [facility]    No orders of the defined types were placed in this encounter.  Cheral Marker CNM, [facility] 08/20/2023 9:56  AM

## 2023-08-20 NOTE — Patient Instructions (Signed)
Shelby Acevedo, thank you for choosing our office today! We appreciate the opportunity to meet your healthcare needs. You may receive a short survey by mail, e-mail, or through [employer]. If you are happy with your care we would appreciate if you could take just a few minutes to complete the survey questions. We read all of your comments and take your feedback very seriously. Thank you again for choosing our office.  Center for [employer] Team at [facility]  [facility] at [facility] ([address] [place], [place] [address]) Entrance C, located off of [address] Free 24/7 valet parking   CLASSES: Go to [email or web address] to register for classes (childbirth, breastfeeding, waterbirth, infant CPR, daddy bootcamp, etc.)  Call the office [phone]) or go to [facility] if: You begin to have strong, frequent contractions Your water breaks.  Sometimes it is a big gush of fluid, sometimes it is just a trickle that keeps getting your panties wet or running down your legs You have vaginal bleeding.  It is normal to have a small amount of spotting if your cervix was checked.  You don't feel your baby moving like normal.  If you don't, get you something to eat and drink and lay down and focus on feeling your baby move.   If your baby is still not moving like normal, you should call the office or go to [facility].  Call the office [phone]) or go to [facility] hospital for these signs of pre-eclampsia: Severe headache that does not go away with Tylenol Visual changes- seeing spots, double, blurred vision Pain under your right breast or upper abdomen that does not go away with Tums or heartburn medicine Nausea and/or vomiting Severe swelling in your hands, feet, and face   Tdap Vaccine It is recommended that you get the Tdap vaccine during the third trimester of EACH pregnancy to help protect your baby from getting pertussis (whooping cough) 27-36 weeks is the BEST time to do  this so that you can pass the protection on to your baby. During pregnancy is better than after pregnancy, but if you are unable to get it during pregnancy it will be offered at the hospital.  You can get this vaccine with [place], at the health department, your family doctor, or some local pharmacies Everyone who will be around your baby should also be up-to-date on their vaccines before the baby comes. Adults (who are not pregnant) only need 1 dose of Tdap during adulthood.   [facility] Pediatricians/Family Doctors [facility]): [address] Colette Ribas, [phone]           [facility]: [address], [phone]                [facility] Medicine [facility]): [address], [phone] (call to ask if accepting patients) [facility] Department: [address], [place], [phone]    [facility] Pediatricians/Family Doctors [facility]): [phone] [address], [phone] [facility]: [address] [place], [phone] [facility]: [address], [phone]  [facility] Doctors  [facility]): [phone] [facility]: [address] [phone]   [facility] Doctors [facility]: [address] [phone]  [facility] Family Doctors  [facility]: [phone], [phone]  Home Blood Pressure Monitoring for Patients   Your provider has recommended that you check your  blood pressure (BP) at least once a week at home. If you do not have a blood pressure cuff at home, one will be provided for you. Contact your provider if you have not received your monitor within 1 week.   Helpful Tips for Accurate Home Blood Pressure Checks  Don't smoke, exercise, or drink caffeine 30 minutes before checking your BP Use the restroom before checking your BP (a full bladder can raise your  pressure) Relax in a comfortable upright chair Feet on the ground Left arm resting comfortably on a flat surface at the level of your heart Legs uncrossed Back supported Sit quietly and don't talk Place the cuff on your bare arm Adjust snuggly, so that only two fingertips can fit between your skin and the top of the cuff Check 2 readings separated by at least one minute Keep a log of your BP readings For a visual, please reference this diagram: http://ccnc.care/bpdiagram  Provider Name: [facility]     Phone: [phone]  Zone 1: ALL CLEAR  Continue to monitor your symptoms:  BP reading is less than 140 (top number) or less than 90 (bottom number)  No right upper stomach pain No headaches or seeing spots No feeling nauseated or throwing up No swelling in face and hands  Zone 2: CAUTION Call your doctor's office for any of the following:  BP reading is greater than 140 (top number) or greater than 90 (bottom number)  Stomach pain under your ribs in the middle or right side Headaches or seeing spots Feeling nauseated or throwing up Swelling in face and hands  Zone 3: EMERGENCY  Seek immediate medical care if you have any of the following:  BP reading is greater than160 (top number) or greater than 110 (bottom number) Severe headaches not improving with Tylenol Serious difficulty catching your breath Any worsening symptoms from Zone 2  Preterm Labor and Birth Information  The normal length of a pregnancy is 39-41 weeks. Preterm labor is when labor starts before 37 completed weeks of pregnancy. What are the risk factors for preterm labor? Preterm labor is more likely to occur in women who: Have certain infections during pregnancy such as a bladder infection, sexually transmitted infection, or infection inside the uterus (chorioamnionitis). Have a shorter-than-normal cervix. Have gone into preterm labor before. Have had surgery on their cervix. Are younger than age 58  or older than age 7. Are African American. Are pregnant with twins or multiple babies (multiple gestation). Take street drugs or smoke while pregnant. Do not gain enough weight while pregnant. Became pregnant shortly after having been pregnant. What are the symptoms of preterm labor? Symptoms of preterm labor include: Cramps similar to those that can happen during a menstrual period. The cramps may happen with diarrhea. Pain in the abdomen or lower back. Regular uterine contractions that may feel like tightening of the abdomen. A feeling of increased pressure in the pelvis. Increased watery or bloody mucus discharge from the vagina. Water breaking (ruptured amniotic sac). Why is it important to recognize signs of preterm labor? It is important to recognize signs of preterm labor because babies who are born prematurely may not be fully developed. This can put them at an increased risk for: Long-term (chronic) heart and lung problems. Difficulty immediately after birth with regulating body systems, including blood sugar, body temperature, heart rate, and breathing rate. Bleeding in the brain. Cerebral palsy. Learning difficulties. Death. These risks are highest for babies who are born before 34 weeks  of pregnancy. How is preterm labor treated? Treatment depends on the length of your pregnancy, your condition, and the health of your baby. It may involve: Having a stitch (suture) placed in your cervix to prevent your cervix from opening too early (cerclage). Taking or being given medicines, such as: Hormone medicines. These may be given early in pregnancy to help support the pregnancy. Medicine to stop contractions. Medicines to help mature the baby's lungs. These may be prescribed if the risk of delivery is high. Medicines to prevent your baby from developing cerebral palsy. If the labor happens before 34 weeks of pregnancy, you may need to stay in the hospital. What should I do if I  think I am in preterm labor? If you think that you are going into preterm labor, call your health care provider right away. How can I prevent preterm labor in future pregnancies? To increase your chance of having a full-term pregnancy: Do not use any tobacco products, such as cigarettes, chewing tobacco, and e-cigarettes. If you need help quitting, ask your health care provider. Do not use street drugs or medicines that have not been prescribed to you during your pregnancy. Talk with your health care provider before taking any herbal supplements, even if you have been taking them regularly. Make sure you gain a healthy amount of weight during your pregnancy. Watch for infection. If you think that you might have an infection, get it checked right away. Make sure to tell your health care provider if you have gone into preterm labor before. This information is not intended to replace advice given to you by your health care provider. Make sure you discuss any questions you have with your health care provider. Document Revised: 02/11/2019 Document Reviewed: 03/12/2016 Elsevier Patient Education  2020 [employer].

## 2023-08-20 NOTE — Addendum Note (Signed)
Addended by: Federico Flake A on: 08/20/2023 10:35 AM   Modules accepted: Orders

## 2023-09-03 ENCOUNTER — Ambulatory Visit: Payer: Medicaid Other | Admitting: Advanced Practice Midwife

## 2023-09-03 ENCOUNTER — Encounter: Payer: Self-pay | Admitting: Advanced Practice Midwife

## 2023-09-03 VITALS — BP 120/74 | HR 88 | Wt 183.0 lb

## 2023-09-03 DIAGNOSIS — Z3480 Encounter for supervision of other normal pregnancy, unspecified trimester: Secondary | ICD-10-CM

## 2023-09-03 DIAGNOSIS — Z3483 Encounter for supervision of other normal pregnancy, third trimester: Secondary | ICD-10-CM

## 2023-09-03 DIAGNOSIS — Z3A34 34 weeks gestation of pregnancy: Secondary | ICD-10-CM

## 2023-09-03 NOTE — Progress Notes (Addendum)
   LOW-RISK PREGNANCY VISIT Patient name: Shelby Acevedo MRN [id number]  Date of birth: 11/15/2000 Chief Complaint:   Routine Prenatal Visit  History of Present Illness:   Shelby Acevedo is a 22 y.o. [id number] female at [redacted]w[redacted]d with an Estimated Date of Delivery: 10/09/23 being seen today for ongoing management of a low-risk pregnancy.  Today she reports normal pregnancy complaints. Contractions: Irritability. Vag. Bleeding: None.  Movement: Present. denies leaking of fluid. Review of Systems:   Pertinent items are noted in HPI Denies abnormal vaginal discharge w/ itching/odor/irritation, headaches, visual changes, shortness of breath, chest pain, abdominal pain, severe nausea/vomiting, or problems with urination or bowel movements unless otherwise stated above. Pertinent History Reviewed:  Reviewed past medical,surgical, social, obstetrical and family history.  Reviewed problem list, medications and allergies. Physical Assessment:   Vitals:   09/03/23 1451  BP: 120/74  Pulse: 88  Weight: 183 lb (83 kg)  Body mass index is 31.41 kg/m.        Physical Examination:   General appearance: Well appearing, and in no distress  Mental status: Alert, oriented to person, place, and time  Skin: Warm & dry  Cardiovascular: Normal heart rate noted  Respiratory: Normal respiratory effort, no distress  Abdomen: Soft, gravid, nontender  Pelvic: Cervical exam deferred         Extremities: Edema: Trace Chaperone:  N/A   Fetal Status: Fetal Heart Rate (bpm): 148 Fundal Height: 34 cm Movement: Present      No results found for this or any previous visit (from the past 24 hour(s)).  Assessment & Plan:    Pregnancy: [id number] at [redacted]w[redacted]d 1. [redacted] weeks gestation of pregnancy   2. Supervision of other normal pregnancy, antepartum      Meds: No orders of the defined types were placed in this encounter.  Labs/procedures today: none  Plan:  Continue routine obstetrical care  Next visit: prefers in  person    Reviewed: Preterm labor symptoms and general obstetric precautions including but not limited to vaginal bleeding, contractions, leaking of fluid and fetal movement were reviewed in detail with the patient.  All questions were answered. Has home bp cuff. Check bp weekly, let us know if >140/90.   Follow-up: Return for As scheduled.  Future Appointments  Date Time Provider Department Center  09/17/2023  9:50 AM Cheral Marker, CNM [facility]  09/23/2023  9:50 AM Cheral Marker, CNM [facility]  09/30/2023  9:50 AM Sue Lush, FNP [facility]  10/07/2023  9:50 AM Arabella Merles, CNM [facility]    No orders of the defined types were placed in this encounter.  Jacklyn Shell DNP, CNM 09/03/2023 3:04 PM

## 2023-09-03 NOTE — Patient Instructions (Signed)
Shelby Acevedo, I greatly value your feedback.  If you receive a survey following your visit with [place] today, we appreciate you taking the time to fill it out.  Thanks, Shelby Beams, DNP, CNM  [facility] HAS MOVED!!! It is now [facility] at [facility] ([address] [place], [place] [address]) Entrance located off of [address] Free 24/7 valet parking   Go to [email or web address] to register for FREE online childbirth classes    Call the office [phone]) or go to [facility] if: You begin to have strong, frequent contractions Your water breaks.  Sometimes it is a big gush of fluid, sometimes it is just a trickle that keeps getting your panties wet or running down your legs You have vaginal bleeding.  It is normal to have a small amount of spotting if your cervix was checked.  You don't feel your baby moving like normal.  If you don't, get you something to eat and drink and lay down and focus on feeling your baby move.  You should feel at least 10 movements in 2 hours.  If you don't, you should call the office or go to [facility].   Home Blood Pressure Monitoring for Patients   Your provider has recommended that you check your blood pressure (BP) at least once a week at home. If you do not have a blood pressure cuff at home, one will be provided for you. Contact your provider if you have not received your monitor within 1 week.   Helpful Tips for Accurate Home Blood Pressure Checks  Don't smoke, exercise, or drink caffeine 30 minutes before checking your BP Use the restroom before checking your BP (a full bladder can raise your pressure) Relax in a comfortable upright chair Feet on the ground Left arm resting comfortably on a flat surface at the level of your heart Legs uncrossed Back supported Sit quietly and don't talk Place the cuff on your bare arm Adjust snuggly, so that only two fingertips can fit between your skin and the top  of the cuff Check 2 readings separated by at least one minute Keep a log of your BP readings For a visual, please reference this diagram: http://ccnc.care/bpdiagram  Provider Name: [facility]     Phone: [phone]  Zone 1: ALL CLEAR  Continue to monitor your symptoms:  BP reading is less than 140 (top number) or less than 90 (bottom number)  No right upper stomach pain No headaches or seeing spots No feeling nauseated or throwing up No swelling in face and hands  Zone 2: CAUTION Call your doctor's office for any of the following:  BP reading is greater than 140 (top number) or greater than 90 (bottom number)  Stomach pain under your ribs in the middle or right side Headaches or seeing spots Feeling nauseated or throwing up Swelling in face and hands  Zone 3: EMERGENCY  Seek immediate medical care if you have any of the following:  BP reading is greater than160 (top number) or greater than 110 (bottom number) Severe headaches not improving with Tylenol Serious difficulty catching your breath Any worsening symptoms from Zone 2

## 2023-09-10 ENCOUNTER — Encounter: Payer: Medicaid Other | Admitting: Advanced Practice Midwife

## 2023-09-11 ENCOUNTER — Emergency Department (HOSPITAL_COMMUNITY)
Admission: EM | Admit: 2023-09-11 | Discharge: 2023-09-12 | Disposition: A | Discharge status: Home / Self Care | Payer: Medicaid Other | Attending: Emergency Medicine | Admitting: Emergency Medicine

## 2023-09-11 ENCOUNTER — Encounter: Payer: Self-pay | Admitting: Women's Health

## 2023-09-11 ENCOUNTER — Encounter (HOSPITAL_COMMUNITY): Payer: Self-pay

## 2023-09-11 ENCOUNTER — Other Ambulatory Visit: Payer: Self-pay

## 2023-09-11 DIAGNOSIS — Z7982 Long term (current) use of aspirin: Secondary | ICD-10-CM | POA: Diagnosis not present

## 2023-09-11 DIAGNOSIS — Z3A36 36 weeks gestation of pregnancy: Secondary | ICD-10-CM | POA: Diagnosis not present

## 2023-09-11 DIAGNOSIS — O99613 Diseases of the digestive system complicating pregnancy, third trimester: Secondary | ICD-10-CM | POA: Diagnosis not present

## 2023-09-11 DIAGNOSIS — K0889 Other specified disorders of teeth and supporting structures: Secondary | ICD-10-CM | POA: Diagnosis not present

## 2023-09-11 DIAGNOSIS — O26893 Other specified pregnancy related conditions, third trimester: Secondary | ICD-10-CM | POA: Diagnosis present

## 2023-09-11 NOTE — ED Triage Notes (Signed)
LEFT sided tooth pain Top molar  Facial swelling  [redacted] weeks pregnant Taking APAP at home without relief

## 2023-09-12 MED ORDER — AMOXICILLIN-POT CLAVULANATE 875-125 MG PO TABS
1.0000 | ORAL_TABLET | Freq: Once | ORAL | Status: AC
  Administered 2023-09-12: 1 via ORAL
  Filled 2023-09-12: qty 1

## 2023-09-12 MED ORDER — AMOXICILLIN-POT CLAVULANATE 875-125 MG PO TABS: 1.0000 | ORAL_TABLET | Freq: Two times a day (BID) | ORAL | 0 refills | Status: DC

## 2023-09-12 NOTE — ED Provider Notes (Signed)
[place] EMERGENCY DEPARTMENT AT [facility]  Provider Note  CSN: [id number] Arrival date & time: 09/11/23 2227  History Chief Complaint  Patient presents with   Dental Pain    Shelby Acevedo is a 22 y.o. female who is approx [redacted]wks pregnant reports several days of L upper toothache, worsening in the last 2 days. No fever or drainage. Not controlled well with APAP. Scheduled to see a dentist on 11/18.    Home Medications Prior to Admission medications   Medication Sig Start Date End Date Taking? Authorizing Provider  amoxicillin-clavulanate (AUGMENTIN) 875-125 MG tablet Take 1 tablet by mouth every 12 (twelve) hours. 09/12/23  Yes Pollyann Savoy, MD  aspirin EC 81 MG tablet Take 2 tablets (162 mg total) by mouth daily. 04/02/23   Cresenzo-Dishmon, Scarlette Calico, CNM  Blood Pressure Monitor MISC For regular home bp monitoring during pregnancy Patient not taking: Reported on 09/03/2023 05/14/23   Cresenzo-Dishmon, Scarlette Calico, CNM  calcium carbonate (TUMS EX) 750 MG chewable tablet Chew 2 tablets by mouth 3 (three) times daily. Patient not taking: Reported on 09/03/2023    [provider]  ferrous gluconate (FERGON) 324 MG tablet Take 1 tablet (324 mg total) by mouth daily with breakfast. 07/15/23   Sue Lush, FNP  omeprazole (PRILOSEC) 20 MG capsule Take 1 capsule (20 mg total) by mouth daily. 1 tablet a day 08/06/23   Lazaro Arms, MD  ondansetron (ZOFRAN-ODT) 4 MG disintegrating tablet Take 1 tablet (4 mg total) by mouth every 6 (six) hours as needed for nausea. Patient not taking: Reported on 09/03/2023 04/02/23   Jacklyn Shell, CNM  prenatal vitamin w/FE, FA (PRENATAL 1 + 1) 27-1 MG TABS tablet Take 1 tablet by mouth daily at 12 noon. 02/05/23   Adline Potter, NP     Allergies    Patient has no known allergies.   Review of Systems   Review of Systems Please see HPI for pertinent positives and negatives  Physical Exam BP 119/75 (BP  Location: Right Arm)   Pulse 85   Temp 98.1 F (36.7 C) (Oral)   Resp 18   Ht 5\' 4"  (1.626 m)   Wt 81.6 kg   LMP 01/02/2023   SpO2 97%   BMI 30.90 kg/m   Physical Exam Vitals and nursing note reviewed.  HENT:     Head: Normocephalic.     Nose: Nose normal.     Mouth/Throat:     Comments: Dental caries in tooth #16, no fluctuance, tender to palpation Eyes:     Extraocular Movements: Extraocular movements intact.  Pulmonary:     Effort: Pulmonary effort is normal.  Musculoskeletal:        General: Normal range of motion.     Cervical back: Neck supple.  Skin:    Findings: No rash (on exposed skin).  Neurological:     Mental Status: She is alert and oriented to person, place, and time.  Psychiatric:        Mood and Affect: Mood normal.     ED Results / Procedures / Treatments   EKG None  Procedures Procedures  Medications Ordered in the ED Medications  amoxicillin-clavulanate (AUGMENTIN) 875-125 MG per tablet 1 tablet (has no administration in time range)    Initial Impression and Plan  Patient here with toothache, likely dental infection, in third trimester. Unfortunately, limited options for pain control. Will continue APAP. Add oral Abx. Recommend she see if dentist can see her sooner. RTED  for any other concerns. No pregnancy related concerns tonight.   ED Course       MDM Rules/Calculators/A&P Medical Decision Making Problems Addressed: Toothache: acute illness or injury  Risk Prescription drug management.     Final Clinical Impression(s) / ED Diagnoses Final diagnoses:  Toothache    Rx / DC Orders ED Discharge Orders          Ordered    amoxicillin-clavulanate (AUGMENTIN) 875-125 MG tablet  Every 12 hours        09/12/23 0032             Pollyann Savoy, MD 09/12/23 [phone]

## 2023-09-17 ENCOUNTER — Ambulatory Visit: Payer: Medicaid Other | Admitting: Women's Health

## 2023-09-17 ENCOUNTER — Encounter: Payer: Self-pay | Admitting: Women's Health

## 2023-09-17 ENCOUNTER — Other Ambulatory Visit (HOSPITAL_COMMUNITY)
Admission: RE | Admit: 2023-09-17 | Discharge: 2023-09-17 | Disposition: A | Discharge status: Home / Self Care | Payer: Medicaid Other | Source: Ambulatory Visit | Attending: Women's Health | Admitting: Women's Health

## 2023-09-17 VITALS — BP 118/73 | HR 63 | Wt 184.4 lb

## 2023-09-17 DIAGNOSIS — Z3A36 36 weeks gestation of pregnancy: Secondary | ICD-10-CM | POA: Insufficient documentation

## 2023-09-17 DIAGNOSIS — Z3480 Encounter for supervision of other normal pregnancy, unspecified trimester: Secondary | ICD-10-CM

## 2023-09-17 DIAGNOSIS — Z3483 Encounter for supervision of other normal pregnancy, third trimester: Secondary | ICD-10-CM | POA: Diagnosis not present

## 2023-09-17 NOTE — Progress Notes (Signed)
LOW-RISK PREGNANCY VISIT Patient name: Shelby Acevedo MRN [id number]  Date of birth: [birth year] Chief Complaint:   Routine Prenatal Visit (culture)  History of Present Illness:   Shelby Acevedo is a 22 y.o. [id number] female at [redacted]w[redacted]d with an Estimated Date of Delivery: 10/09/23 being seen today for ongoing management of a low-risk pregnancy.   Today she reports no complaints. Contractions: Irregular.  .  Movement: Present. denies leaking of fluid.     07/09/2023    9:31 AM 04/02/2023   11:08 AM 02/05/2023   10:40 AM 09/06/2020    2:47 PM  Depression screen PHQ 2/9  Decreased Interest 0 0 0 0  Down, Depressed, Hopeless 0 0 0 1  PHQ - 2 Score 0 0 0 1  Altered sleeping 0 0 1 0  Tired, decreased energy 0 2 1 0  Change in appetite  0 1 0  Feeling bad or failure about yourself  0 0 0 0  Trouble concentrating 0 0 0 0  Moving slowly or fidgety/restless 0 0 0 0  Suicidal thoughts 0 0 0 0  PHQ-9 Score 0 2 3 1   Difficult doing work/chores    Not difficult at all        07/09/2023    9:31 AM 04/02/2023   11:08 AM 02/05/2023   10:40 AM 09/06/2020    2:48 PM  GAD 7 : Generalized Anxiety Score  Nervous, Anxious, on Edge 0 1 1 1   Control/stop worrying 0 0 0 1  Worry too much - different things 0 0 0 0  Trouble relaxing 0 0 1 0  Restless 0 0 0 0  Easily annoyed or irritable 0 0 1 1  Afraid - awful might happen 0 0 0 0  Total GAD 7 Score 0 1 3 3   Anxiety Difficulty    Not difficult at all      Review of Systems:   Pertinent items are noted in HPI Denies abnormal vaginal discharge w/ itching/odor/irritation, headaches, visual changes, shortness of breath, chest pain, abdominal pain, severe nausea/vomiting, or problems with urination or bowel movements unless otherwise stated above. Pertinent History Reviewed:  Reviewed past medical,surgical, social, obstetrical and family history.  Reviewed problem list, medications and allergies. Physical Assessment:   Vitals:   09/17/23 0952  BP:  118/73  Pulse: 63  Weight: 184 lb 6.4 oz (83.6 kg)  Body mass index is 31.65 kg/m.        Physical Examination:   General appearance: Well appearing, and in no distress  Mental status: Alert, oriented to person, place, and time  Skin: Warm & dry  Cardiovascular: Normal heart rate noted  Respiratory: Normal respiratory effort, no distress  Abdomen: Soft, gravid, nontender  Pelvic: Cervical exam performed  offered pap today w/ cultures, still wants to wait til pp Dilation: 1.5 Effacement (%): 50 Station: Ballotable  Extremities: Edema: Trace  Fetal Status: Fetal Heart Rate (bpm): 140 Fundal Height: 36 cm Movement: Present Presentation: Vertex  Chaperone: Peggy Dones   No results found for this or any previous visit (from the past 24 hour(s)).  Assessment & Plan:  1) Low-risk pregnancy [id number] at [redacted]w[redacted]d with an Estimated Date of Delivery: 10/09/23   2) H/O pre-e, bp good, continue checking daily at home, if >140/90 or pre-e s/s, let us know/seek care, continue ASA   Meds: No orders of the defined types were placed in this encounter.  Labs/procedures today: GBS, GC/CT, and SVE  Plan:  Continue routine obstetrical  care  Next visit: prefers in person    Reviewed: Preterm labor symptoms and general obstetric precautions including but not limited to vaginal bleeding, contractions, leaking of fluid and fetal movement were reviewed in detail with the patient.  All questions were answered. Does have home bp cuff. Office bp cuff given: not applicable. Check bp daily, let us know if consistently >140 and/or >90.  Follow-up: Return for As scheduled.  Future Appointments  Date Time Provider Department Center  09/23/2023  9:50 AM Cheral Marker, CNM [facility]  09/30/2023  9:50 AM Sue Lush, FNP [facility]  10/07/2023  9:50 AM Arabella Merles, CNM [facility]    Orders Placed This Encounter  Procedures   Culture, beta strep (group b only)   Cheral Marker CNM,  [facility] 09/17/2023 10:23 AM

## 2023-09-17 NOTE — Patient Instructions (Signed)
Kimori, thank you for choosing our office today! We appreciate the opportunity to meet your healthcare needs. You may receive a short survey by mail, e-mail, or through [employer]. If you are happy with your care we would appreciate if you could take just a few minutes to complete the survey questions. We read all of your comments and take your feedback very seriously. Thank you again for choosing our office.  Center for [employer] Team at [facility]  [facility] at [facility] ([address] [place], [place] [address]) Entrance C, located off of [address] Free 24/7 valet parking   CLASSES: Go to [email or web address] to register for classes (childbirth, breastfeeding, waterbirth, infant CPR, daddy bootcamp, etc.)  Call the office [phone]) or go to [facility] if: You begin to have strong, frequent contractions Your water breaks.  Sometimes it is a big gush of fluid, sometimes it is just a trickle that keeps getting your panties wet or running down your legs You have vaginal bleeding.  It is normal to have a small amount of spotting if your cervix was checked.  You don't feel your baby moving like normal.  If you don't, get you something to eat and drink and lay down and focus on feeling your baby move.   If your baby is still not moving like normal, you should call the office or go to [facility].  Call the office [phone]) or go to [facility] hospital for these signs of pre-eclampsia: Severe headache that does not go away with Tylenol Visual changes- seeing spots, double, blurred vision Pain under your right breast or upper abdomen that does not go away with Tums or heartburn medicine Nausea and/or vomiting Severe swelling in your hands, feet, and face   [facility] Pediatricians/Family Doctors [facility]): [address] Colette Ribas, [phone]           [facility]: [address], [phone]                 [facility] Family [facility]): [address], [phone] (call to ask if accepting patients) [facility] Department: [address] [place], [phone]    [facility] Pediatricians/Family Doctors [facility]): [address], [phone] [facility]: [address] [place], [phone] [facility]: [address], [phone]  [facility] Doctors  [facility]): [phone] [facility]: [address], [phone]   [facility] Doctors [facility]: [address] [phone]  [facility] Doctors  [facility]: [phone], [phone]  Home Blood Pressure Monitoring for Patients   Your provider has recommended that you check your blood pressure (BP) at least once a week at home. If you do not have a blood pressure cuff at home, one will be provided for you. Contact your provider if you have not received your monitor within 1 week.   Helpful Tips for Accurate Home Blood Pressure Checks  Don't smoke, exercise, or drink caffeine 30 minutes before checking your BP Use the restroom before checking your BP (a full bladder can raise your pressure) Relax in a comfortable upright chair Feet on the ground Left arm resting comfortably on a flat surface at the level of your heart Legs uncrossed Back supported Sit quietly and don't talk Place the cuff on your bare arm Adjust snuggly, so that only two fingertips  can fit between your skin and the top of the cuff Check 2 readings separated by at least one minute Keep a log of your BP readings For a visual, please reference this diagram: http://ccnc.care/bpdiagram  Provider Name: [facility]     Phone: [phone]  Zone 1: ALL CLEAR  Continue to monitor your symptoms:  BP reading is less than 140 (top number) or less than 90 (bottom number)  No right  upper stomach pain No headaches or seeing spots No feeling nauseated or throwing up No swelling in face and hands  Zone 2: CAUTION Call your doctor's office for any of the following:  BP reading is greater than 140 (top number) or greater than 90 (bottom number)  Stomach pain under your ribs in the middle or right side Headaches or seeing spots Feeling nauseated or throwing up Swelling in face and hands  Zone 3: EMERGENCY  Seek immediate medical care if you have any of the following:  BP reading is greater than160 (top number) or greater than 110 (bottom number) Severe headaches not improving with Tylenol Serious difficulty catching your breath Any worsening symptoms from Zone 2   Braxton Hicks Contractions Contractions of the uterus can occur throughout pregnancy, but they are not always a sign that you are in labor. You may have practice contractions called Braxton Hicks contractions. These false labor contractions are sometimes confused with true labor. What are Deberah Pelton contractions? Braxton Hicks contractions are tightening movements that occur in the muscles of the uterus before labor. Unlike true labor contractions, these contractions do not result in opening (dilation) and thinning of the cervix. Toward the end of pregnancy (32-34 weeks), Braxton Hicks contractions can happen more often and may become stronger. These contractions are sometimes difficult to tell apart from true labor because they can be very uncomfortable. You should not feel embarrassed if you go to the hospital with false labor. Sometimes, the only way to tell if you are in true labor is for your health care provider to look for changes in the cervix. The health care provider will do a physical exam and may monitor your contractions. If you are not in true labor, the exam should show that your cervix is not dilating and your water has not broken. If there are no other health problems associated with your  pregnancy, it is completely safe for you to be sent home with false labor. You may continue to have Braxton Hicks contractions until you go into true labor. How to tell the difference between true labor and false labor True labor Contractions last 30-70 seconds. Contractions become very regular. Discomfort is usually felt in the top of the uterus, and it spreads to the lower abdomen and low back. Contractions do not go away with walking. Contractions usually become more intense and increase in frequency. The cervix dilates and gets thinner. False labor Contractions are usually shorter and not as strong as true labor contractions. Contractions are usually irregular. Contractions are often felt in the front of the lower abdomen and in the groin. Contractions may go away when you walk around or change positions while lying down. Contractions get weaker and are shorter-lasting as time goes on. The cervix usually does not dilate or become thin. Follow these instructions at home:  Take over-the-counter and prescription medicines only as told by your health care provider. Keep up with your usual exercises and follow other instructions from your health care provider. Eat and drink lightly if you think  you are going into labor. If Braxton Hicks contractions are making you uncomfortable: Change your position from lying down or resting to walking, or change from walking to resting. Sit and rest in a tub of warm water. Drink enough fluid to keep your urine pale yellow. Dehydration may cause these contractions. Do slow and deep breathing several times an hour. Keep all follow-up prenatal visits as told by your health care provider. This is important. Contact a health care provider if: You have a fever. You have continuous pain in your abdomen. Get help right away if: Your contractions become stronger, more regular, and closer together. You have fluid leaking or gushing from your vagina. You pass  blood-tinged mucus (bloody show). You have bleeding from your vagina. You have low back pain that you never had before. You feel your baby's head pushing down and causing pelvic pressure. Your baby is not moving inside you as much as it used to. Summary Contractions that occur before labor are called Braxton Hicks contractions, false labor, or practice contractions. Braxton Hicks contractions are usually shorter, weaker, farther apart, and less regular than true labor contractions. True labor contractions usually become progressively stronger and regular, and they become more frequent. Manage discomfort from [facility] contractions by changing position, resting in a warm bath, drinking plenty of water, or practicing deep breathing. This information is not intended to replace advice given to you by your health care provider. Make sure you discuss any questions you have with your health care provider. Document Revised: 10/02/2017 Document Reviewed: 03/05/2017 Elsevier Patient Education  2020 [employer].

## 2023-09-18 LAB — CERVICOVAGINAL ANCILLARY ONLY
Chlamydia: NEGATIVE
Comment: NEGATIVE
Comment: NORMAL
Neisseria Gonorrhea: NEGATIVE

## 2023-09-21 LAB — CULTURE, BETA STREP (GROUP B ONLY): Strep Gp B Culture: NEGATIVE

## 2023-09-23 ENCOUNTER — Ambulatory Visit (INDEPENDENT_AMBULATORY_CARE_PROVIDER_SITE_OTHER): Payer: Medicaid Other | Admitting: Women's Health

## 2023-09-23 ENCOUNTER — Encounter: Payer: Self-pay | Admitting: Women's Health

## 2023-09-23 VITALS — BP 116/81 | HR 61 | Wt 188.5 lb

## 2023-09-23 DIAGNOSIS — Z3483 Encounter for supervision of other normal pregnancy, third trimester: Secondary | ICD-10-CM

## 2023-09-23 DIAGNOSIS — Z3A37 37 weeks gestation of pregnancy: Secondary | ICD-10-CM

## 2023-09-23 DIAGNOSIS — Z3480 Encounter for supervision of other normal pregnancy, unspecified trimester: Secondary | ICD-10-CM

## 2023-09-23 NOTE — Progress Notes (Signed)
LOW-RISK PREGNANCY VISIT Patient name: Shelby Acevedo MRN [id number]  Date of birth: [birth year] Chief Complaint:   Routine Prenatal Visit  History of Present Illness:   Shelby Acevedo is a 22 y.o. [id number] female at [redacted]w[redacted]d with an Estimated Date of Delivery: 10/09/23 being seen today for ongoing management of a low-risk pregnancy.   Today she reports no complaints. Contractions: Irritability. Vag. Bleeding: None.  Movement: Present. denies leaking of fluid.     07/09/2023    9:31 AM 04/02/2023   11:08 AM 02/05/2023   10:40 AM 09/06/2020    2:47 PM  Depression screen PHQ 2/9  Decreased Interest 0 0 0 0  Down, Depressed, Hopeless 0 0 0 1  PHQ - 2 Score 0 0 0 1  Altered sleeping 0 0 1 0  Tired, decreased energy 0 2 1 0  Change in appetite  0 1 0  Feeling bad or failure about yourself  0 0 0 0  Trouble concentrating 0 0 0 0  Moving slowly or fidgety/restless 0 0 0 0  Suicidal thoughts 0 0 0 0  PHQ-9 Score 0 2 3 1   Difficult doing work/chores    Not difficult at all        07/09/2023    9:31 AM 04/02/2023   11:08 AM 02/05/2023   10:40 AM 09/06/2020    2:48 PM  GAD 7 : Generalized Anxiety Score  Nervous, Anxious, on Edge 0 1 1 1   Control/stop worrying 0 0 0 1  Worry too much - different things 0 0 0 0  Trouble relaxing 0 0 1 0  Restless 0 0 0 0  Easily annoyed or irritable 0 0 1 1  Afraid - awful might happen 0 0 0 0  Total GAD 7 Score 0 1 3 3   Anxiety Difficulty    Not difficult at all      Review of Systems:   Pertinent items are noted in HPI Denies abnormal vaginal discharge w/ itching/odor/irritation, headaches, visual changes, shortness of breath, chest pain, abdominal pain, severe nausea/vomiting, or problems with urination or bowel movements unless otherwise stated above. Pertinent History Reviewed:  Reviewed past medical,surgical, social, obstetrical and family history.  Reviewed problem list, medications and allergies. Physical Assessment:   Vitals:   09/23/23  1000  BP: 116/81  Pulse: 61  Weight: 188 lb 8 oz (85.5 kg)  Body mass index is 32.36 kg/m.        Physical Examination:   General appearance: Well appearing, and in no distress  Mental status: Alert, oriented to person, place, and time  Skin: Warm & dry  Cardiovascular: Normal heart rate noted  Respiratory: Normal respiratory effort, no distress  Abdomen: Soft, gravid, nontender  Pelvic: Cervical exam deferred         Extremities: Edema: None  Fetal Status: Fetal Heart Rate (bpm): 139 Fundal Height: 36 cm Movement: Present    Chaperone: N/A   No results found for this or any previous visit (from the past 24 hour(s)).  Assessment & Plan:  1) Low-risk pregnancy [id number] at [redacted]w[redacted]d with an Estimated Date of Delivery: 10/09/23   2) H/O pre-e, reviewed pre-e s/s, reasons to seek care, check bp daily, let us know if >140/90   Meds: No orders of the defined types were placed in this encounter.  Labs/procedures today: none  Plan:  Continue routine obstetrical care  Next visit: prefers in person    Reviewed: Term labor symptoms and general obstetric precautions including  but not limited to vaginal bleeding, contractions, leaking of fluid and fetal movement were reviewed in detail with the patient.  All questions were answered. Does have home bp cuff. Office bp cuff given: not applicable. Check bp daily, let us know if consistently >140 and/or >90.  Follow-up: Return for As scheduled.  Future Appointments  Date Time Provider Department Center  09/30/2023  9:50 AM Sue Lush, FNP [facility]  10/07/2023  9:50 AM Arabella Merles, CNM [facility]    No orders of the defined types were placed in this encounter.  Shelby Marker CNM, [facility] 09/23/2023 10:08 AM

## 2023-09-23 NOTE — Patient Instructions (Signed)
 Kimori, thank you for choosing our office today! We appreciate the opportunity to meet your healthcare needs. You may receive a short survey by mail, e-mail, or through [employer]. If you are happy with your care we would appreciate if you could take just a few minutes to complete the survey questions. We read all of your comments and take your feedback very seriously. Thank you again for choosing our office.  Center for [employer] Team at [facility]  [facility] at [facility] ([address] [place], [place] [address]) Entrance C, located off of [address] Free 24/7 valet parking   CLASSES: Go to [email or web address] to register for classes (childbirth, breastfeeding, waterbirth, infant CPR, daddy bootcamp, etc.)  Call the office [phone]) or go to [facility] if: You begin to have strong, frequent contractions Your water breaks.  Sometimes it is a big gush of fluid, sometimes it is just a trickle that keeps getting your panties wet or running down your legs You have vaginal bleeding.  It is normal to have a small amount of spotting if your cervix was checked.  You don't feel your baby moving like normal.  If you don't, get you something to eat and drink and lay down and focus on feeling your baby move.   If your baby is still not moving like normal, you should call the office or go to [facility].  Call the office [phone]) or go to [facility] hospital for these signs of pre-eclampsia: Severe headache that does not go away with Tylenol Visual changes- seeing spots, double, blurred vision Pain under your right breast or upper abdomen that does not go away with Tums or heartburn medicine Nausea and/or vomiting Severe swelling in your hands, feet, and face   [facility] Pediatricians/Family Doctors [facility]): [address] Colette Ribas, [phone]           [facility]: [address], [phone]                 [facility]): [address], [phone] (call to ask if accepting patients) [facility] Department: [address] [place], [phone]    [facility] Pediatricians/Family Doctors [facility]): [address], [phone] [facility]: [address] [place], [phone] [facility]: [address], [phone]  [facility] Doctors  [facility]): [phone] [facility]: [address], [phone]   [facility] Doctors [facility]: [address] [phone]  [facility] Doctors  [facility]: [phone], [phone]  Home Blood Pressure Monitoring for Patients   Your provider has recommended that you check your blood pressure (BP) at least once a week at home. If you do not have a blood pressure cuff at home, one will be provided for you. Contact your provider if you have not received your monitor within 1 week.   Helpful Tips for Accurate Home Blood Pressure Checks  Don't smoke, exercise, or drink caffeine 30 minutes before checking your BP Use the restroom before checking your BP (a full bladder can raise your pressure) Relax in a comfortable upright chair Feet on the ground Left arm resting comfortably on a flat surface at the level of your heart Legs uncrossed Back supported Sit quietly and don't talk Place the cuff on your bare arm Adjust snuggly, so that only two fingertips  can fit between your skin and the top of the cuff Check 2 readings separated by at least one minute Keep a log of your BP readings For a visual, please reference this diagram: http://ccnc.care/bpdiagram  Provider Name: [facility]     Phone: [phone]  Zone 1: ALL CLEAR  Continue to monitor your symptoms:  BP reading is less than 140 (top number) or less than 90 (bottom number)  No right  upper stomach pain No headaches or seeing spots No feeling nauseated or throwing up No swelling in face and hands  Zone 2: CAUTION Call your doctor's office for any of the following:  BP reading is greater than 140 (top number) or greater than 90 (bottom number)  Stomach pain under your ribs in the middle or right side Headaches or seeing spots Feeling nauseated or throwing up Swelling in face and hands  Zone 3: EMERGENCY  Seek immediate medical care if you have any of the following:  BP reading is greater than160 (top number) or greater than 110 (bottom number) Severe headaches not improving with Tylenol Serious difficulty catching your breath Any worsening symptoms from Zone 2   Braxton Hicks Contractions Contractions of the uterus can occur throughout pregnancy, but they are not always a sign that you are in labor. You may have practice contractions called Braxton Hicks contractions. These false labor contractions are sometimes confused with true labor. What are Deberah Pelton contractions? Braxton Hicks contractions are tightening movements that occur in the muscles of the uterus before labor. Unlike true labor contractions, these contractions do not result in opening (dilation) and thinning of the cervix. Toward the end of pregnancy (32-34 weeks), Braxton Hicks contractions can happen more often and may become stronger. These contractions are sometimes difficult to tell apart from true labor because they can be very uncomfortable. You should not feel embarrassed if you go to the hospital with false labor. Sometimes, the only way to tell if you are in true labor is for your health care provider to look for changes in the cervix. The health care provider will do a physical exam and may monitor your contractions. If you are not in true labor, the exam should show that your cervix is not dilating and your water has not broken. If there are no other health problems associated with your  pregnancy, it is completely safe for you to be sent home with false labor. You may continue to have Braxton Hicks contractions until you go into true labor. How to tell the difference between true labor and false labor True labor Contractions last 30-70 seconds. Contractions become very regular. Discomfort is usually felt in the top of the uterus, and it spreads to the lower abdomen and low back. Contractions do not go away with walking. Contractions usually become more intense and increase in frequency. The cervix dilates and gets thinner. False labor Contractions are usually shorter and not as strong as true labor contractions. Contractions are usually irregular. Contractions are often felt in the front of the lower abdomen and in the groin. Contractions may go away when you walk around or change positions while lying down. Contractions get weaker and are shorter-lasting as time goes on. The cervix usually does not dilate or become thin. Follow these instructions at home:  Take over-the-counter and prescription medicines only as told by your health care provider. Keep up with your usual exercises and follow other instructions from your health care provider. Eat and drink lightly if you think  you are going into labor. If Braxton Hicks contractions are making you uncomfortable: Change your position from lying down or resting to walking, or change from walking to resting. Sit and rest in a tub of warm water. Drink enough fluid to keep your urine pale yellow. Dehydration may cause these contractions. Do slow and deep breathing several times an hour. Keep all follow-up prenatal visits as told by your health care provider. This is important. Contact a health care provider if: You have a fever. You have continuous pain in your abdomen. Get help right away if: Your contractions become stronger, more regular, and closer together. You have fluid leaking or gushing from your vagina. You pass  blood-tinged mucus (bloody show). You have bleeding from your vagina. You have low back pain that you never had before. You feel your baby's head pushing down and causing pelvic pressure. Your baby is not moving inside you as much as it used to. Summary Contractions that occur before labor are called Braxton Hicks contractions, false labor, or practice contractions. Braxton Hicks contractions are usually shorter, weaker, farther apart, and less regular than true labor contractions. True labor contractions usually become progressively stronger and regular, and they become more frequent. Manage discomfort from [facility] contractions by changing position, resting in a warm bath, drinking plenty of water, or practicing deep breathing. This information is not intended to replace advice given to you by your health care provider. Make sure you discuss any questions you have with your health care provider. Document Revised: 10/02/2017 Document Reviewed: 03/05/2017 Elsevier Patient Education  2020 [employer].

## 2023-09-30 ENCOUNTER — Ambulatory Visit: Payer: Medicaid Other | Admitting: Obstetrics and Gynecology

## 2023-09-30 ENCOUNTER — Encounter: Payer: Self-pay | Admitting: Obstetrics and Gynecology

## 2023-09-30 VITALS — BP 126/81 | HR 75 | Wt 189.0 lb

## 2023-09-30 DIAGNOSIS — O09899 Supervision of other high risk pregnancies, unspecified trimester: Secondary | ICD-10-CM

## 2023-09-30 DIAGNOSIS — O09299 Supervision of pregnancy with other poor reproductive or obstetric history, unspecified trimester: Secondary | ICD-10-CM

## 2023-09-30 DIAGNOSIS — Z3480 Encounter for supervision of other normal pregnancy, unspecified trimester: Secondary | ICD-10-CM

## 2023-09-30 DIAGNOSIS — O360130 Maternal care for anti-D [Rh] antibodies, third trimester, not applicable or unspecified: Secondary | ICD-10-CM

## 2023-09-30 DIAGNOSIS — O09293 Supervision of pregnancy with other poor reproductive or obstetric history, third trimester: Secondary | ICD-10-CM

## 2023-09-30 DIAGNOSIS — Z3A38 38 weeks gestation of pregnancy: Secondary | ICD-10-CM

## 2023-09-30 NOTE — Progress Notes (Signed)
   PRENATAL VISIT NOTE  Subjective:  Shelby Acevedo is a 22 y.o. [id number] at [redacted]w[redacted]d being seen today for ongoing prenatal care.  She is currently monitored for the following issues for this low-risk pregnancy and has History of pre-eclampsia in prior pregnancy, currently pregnant; Encounter for supervision of normal pregnancy, antepartum; and Rubella non-immune status, antepartum on their problem list.  Patient reports no complaints.  Contractions: Regular (every 10-15 minutes like). Vag. Bleeding: None.  Movement: Present. Denies leaking of fluid.   The following portions of the patient's history were reviewed and updated as appropriate: allergies, current medications, past family history, past medical history, past social history, past surgical history and problem list.   Objective:   Vitals:   09/30/23 1000  BP: 126/81  Pulse: 75  Weight: 189 lb (85.7 kg)    Fetal Status: Fetal Heart Rate (bpm): 136 Fundal Height: 37 cm Movement: Present     General:  Alert, oriented and cooperative. Patient is in no acute distress.  Skin: Skin is warm and dry. No rash noted.   Cardiovascular: Normal heart rate noted  Respiratory: Normal respiratory effort, no problems with respiration noted  Abdomen: Soft, gravid, appropriate for gestational age.  Pain/Pressure: Present     Pelvic: Cervical exam performed in the presence of a chaperone Dilation: 1.5 Effacement (%): Thick    Extremities: Normal range of motion.  Edema: Trace  Mental Status: Normal mood and affect. Normal behavior. Normal judgment and thought content.   Assessment and Plan:  Pregnancy: [id number] at [redacted]w[redacted]d 1. Supervision of other normal pregnancy, antepartum BP and FHR normal Doing well, feeling regular movement    2. [redacted] weeks gestation of pregnancy Labor precautions discussed Unable to perform membrane sweep today, consider next week  3. Rubella non-immune status, antepartum Offer MMR pp  4. History of pre-eclampsia in prior  pregnancy, currently pregnant Normotensive today, follow up precautions discussed    Preterm labor symptoms and general obstetric precautions including but not limited to vaginal bleeding, contractions, leaking of fluid and fetal movement were reviewed in detail with the patient. Please refer to After Visit Summary for other counseling recommendations.   Return in one week for routine prenatal  Future Appointments  Date Time Provider Department Center  10/07/2023  9:50 AM Arabella Merles, CNM [facility]    Albertine Grates, FNP

## 2023-10-07 ENCOUNTER — Encounter: Payer: Self-pay | Admitting: Advanced Practice Midwife

## 2023-10-07 ENCOUNTER — Other Ambulatory Visit: Payer: Self-pay | Admitting: Advanced Practice Midwife

## 2023-10-07 ENCOUNTER — Ambulatory Visit (INDEPENDENT_AMBULATORY_CARE_PROVIDER_SITE_OTHER): Payer: Medicaid Other | Admitting: Advanced Practice Midwife

## 2023-10-07 VITALS — BP 129/79 | HR 60 | Wt 186.3 lb

## 2023-10-07 DIAGNOSIS — Z3A39 39 weeks gestation of pregnancy: Secondary | ICD-10-CM

## 2023-10-07 DIAGNOSIS — O480 Post-term pregnancy: Secondary | ICD-10-CM

## 2023-10-07 DIAGNOSIS — Z3480 Encounter for supervision of other normal pregnancy, unspecified trimester: Secondary | ICD-10-CM

## 2023-10-07 DIAGNOSIS — Z3483 Encounter for supervision of other normal pregnancy, third trimester: Secondary | ICD-10-CM

## 2023-10-07 NOTE — Progress Notes (Signed)
   LOW-RISK PREGNANCY VISIT Patient name: Shelby Acevedo MRN [id number]  Date of birth: 06/28/2001 Chief Complaint:   Routine Prenatal Visit (Cervical check)  History of Present Illness:   Shelby Acevedo is a 22 y.o. [id number] female at [redacted]w[redacted]d with an Estimated Date of Delivery: 10/09/23 being seen today for ongoing management of a low-risk pregnancy.  Today she reports no complaints. Contractions: Irregular.  .  Movement: Present. denies leaking of fluid. Review of Systems:   Pertinent items are noted in HPI Denies abnormal vaginal discharge w/ itching/odor/irritation, headaches, visual changes, shortness of breath, chest pain, abdominal pain, severe nausea/vomiting, or problems with urination or bowel movements unless otherwise stated above. Pertinent History Reviewed:  Reviewed past medical,surgical, social, obstetrical and family history.  Reviewed problem list, medications and allergies. Physical Assessment:   Vitals:   10/07/23 0953  BP: 129/79  Pulse: 60  Weight: 186 lb 4.8 oz (84.5 kg)  Body mass index is 31.98 kg/m.        Physical Examination:   General appearance: Well appearing, and in no distress  Mental status: Alert, oriented to person, place, and time  Skin: Warm & dry  Cardiovascular: Normal heart rate noted  Respiratory: Normal respiratory effort, no distress  Abdomen: Soft, gravid, nontender  Pelvic: Cervical exam performed  Dilation: 1.5 Effacement (%): 50 Station: -3; posterior  Extremities: Edema: Trace  Fetal Status: Fetal Heart Rate (bpm): 125   Movement: Present Presentation: Vertex  No results found for this or any previous visit (from the past 24 hour(s)).  Assessment & Plan:  1) Low-risk pregnancy [id number] at [redacted]w[redacted]d with an Estimated Date of Delivery: 10/09/23   2) Plan for postdates if no labor, IOL set for 10/16/23 at [place], orders in; sched for NST only on 10/12/23  3) Hx pre-e, call with ^BPs or symptoms   Meds: No orders of the defined types were  placed in this encounter.  Labs/procedures today: SVE  Plan:  Continue routine obstetrical care   Reviewed: Term labor symptoms and general obstetric precautions including but not limited to vaginal bleeding, contractions, leaking of fluid and fetal movement were reviewed in detail with the patient.  All questions were answered. Has home bp cuff. Check bp weekly, let us know if >140/90.   Follow-up: Return for NST w RN on Monday (doesn't need to see provider).  No orders of the defined types were placed in this encounter.  Arabella Merles CNM 10/07/2023 10:23 AM

## 2023-10-07 NOTE — Patient Instructions (Signed)
Your induction is scheduled for 10/16/23. You should be there on 10/15/23 at 11:45pm  Go to the main desk at the [facility] and let them know you are there to be induced. They will send someone from Labor & Delivery to come get you.  You will get a call from a nurse from the hospital within the next day or so to go over some information, she will also schedule you to go to the hospital a few days before you induction to have your Covid test. If you have any questions, please let us know.

## 2023-10-09 ENCOUNTER — Telehealth (HOSPITAL_COMMUNITY): Payer: Self-pay | Admitting: *Deleted

## 2023-10-09 ENCOUNTER — Encounter (HOSPITAL_COMMUNITY): Payer: Self-pay

## 2023-10-09 NOTE — Telephone Encounter (Signed)
Preadmission screen  

## 2023-10-12 ENCOUNTER — Encounter (HOSPITAL_COMMUNITY): Payer: Self-pay | Admitting: *Deleted

## 2023-10-12 ENCOUNTER — Telehealth (HOSPITAL_COMMUNITY): Payer: Self-pay | Admitting: *Deleted

## 2023-10-12 ENCOUNTER — Ambulatory Visit (INDEPENDENT_AMBULATORY_CARE_PROVIDER_SITE_OTHER): Payer: Medicaid Other | Admitting: *Deleted

## 2023-10-12 VITALS — BP 115/75 | HR 58 | Wt 189.0 lb

## 2023-10-12 DIAGNOSIS — O480 Post-term pregnancy: Secondary | ICD-10-CM | POA: Diagnosis not present

## 2023-10-12 DIAGNOSIS — Z3A40 40 weeks gestation of pregnancy: Secondary | ICD-10-CM | POA: Diagnosis not present

## 2023-10-12 NOTE — Telephone Encounter (Signed)
Preadmission screen  

## 2023-10-12 NOTE — Progress Notes (Signed)
   NURSE VISIT- NST  SUBJECTIVE:  Helen Folck is a 22 y.o. [phone] female at [redacted]w[redacted]d, here for a NST for pregnancy complicated by Post dates.  She reports active fetal movement, contractions: irregular, every 3-4 minutes, vaginal bleeding: none, membranes: intact.   OBJECTIVE:  BP 115/75   Pulse (!) 58   Wt 189 lb (85.7 kg)   LMP 01/02/2023   BMI 32.44 kg/m   Appears well, no apparent distress  No results found for this or any previous visit (from the past 24 hour(s)).  NST: FHR baseline 125 bpm, Variability: moderate, Accelerations:present, Decelerations:  Absent= Cat 1/reactive Toco: irregular, every 3-4 minutes   ASSESSMENT: [id number] at [redacted]w[redacted]d with Post dates NST reactive  PLAN: EFM strip reviewed by Dr. Despina Hidden   Recommendations:  scheduled for induction on Thursday night.      Annamarie Dawley  10/12/2023 11:57 AM

## 2023-10-14 ENCOUNTER — Other Ambulatory Visit: Payer: Self-pay | Admitting: Advanced Practice Midwife

## 2023-10-15 ENCOUNTER — Encounter (HOSPITAL_COMMUNITY): Payer: Self-pay | Admitting: Obstetrics and Gynecology

## 2023-10-15 ENCOUNTER — Encounter: Payer: Self-pay | Admitting: Women's Health

## 2023-10-15 ENCOUNTER — Inpatient Hospital Stay (HOSPITAL_COMMUNITY)
Admission: AD | Admit: 2023-10-15 | Discharge: 2023-10-16 | DRG: 807 | Disposition: A | Discharge status: Home / Self Care | Payer: Medicaid Other | Attending: Obstetrics and Gynecology | Admitting: Obstetrics and Gynecology

## 2023-10-15 DIAGNOSIS — Z7982 Long term (current) use of aspirin: Secondary | ICD-10-CM | POA: Diagnosis not present

## 2023-10-15 DIAGNOSIS — O480 Post-term pregnancy: Secondary | ICD-10-CM | POA: Diagnosis not present

## 2023-10-15 DIAGNOSIS — O4202 Full-term premature rupture of membranes, onset of labor within 24 hours of rupture: Secondary | ICD-10-CM | POA: Diagnosis not present

## 2023-10-15 DIAGNOSIS — Z833 Family history of diabetes mellitus: Secondary | ICD-10-CM | POA: Diagnosis not present

## 2023-10-15 DIAGNOSIS — Z8249 Family history of ischemic heart disease and other diseases of the circulatory system: Secondary | ICD-10-CM

## 2023-10-15 DIAGNOSIS — Z3A40 40 weeks gestation of pregnancy: Secondary | ICD-10-CM

## 2023-10-15 DIAGNOSIS — O26893 Other specified pregnancy related conditions, third trimester: Secondary | ICD-10-CM | POA: Diagnosis present

## 2023-10-15 LAB — CBC
HCT: 28.5 % — ABNORMAL LOW (ref 36.0–46.0)
Hemoglobin: 9 g/dL — ABNORMAL LOW (ref 12.0–15.0)
MCH: 26.4 pg (ref 26.0–34.0)
MCHC: 31.6 g/dL (ref 30.0–36.0)
MCV: 83.6 fL (ref 80.0–100.0)
Platelets: 201 10*3/uL (ref 150–400)
RBC: 3.41 MIL/uL — ABNORMAL LOW (ref 3.87–5.11)
RDW: 14.7 % (ref 11.5–15.5)
WBC: 14.6 10*3/uL — ABNORMAL HIGH (ref 4.0–10.5)
nRBC: 0 % (ref 0.0–0.2)

## 2023-10-15 LAB — TYPE AND SCREEN
ABO/RH(D): A POS
Antibody Screen: NEGATIVE

## 2023-10-15 MED ORDER — IBUPROFEN 600 MG PO TABS
600.0000 mg | ORAL_TABLET | Freq: Four times a day (QID) | ORAL | Status: DC
  Administered 2023-10-15 – 2023-10-16 (×4): 600 mg via ORAL
  Filled 2023-10-15 (×4): qty 1

## 2023-10-15 MED ORDER — LIDOCAINE HCL (PF) 1 % IJ SOLN
INTRAMUSCULAR | Status: AC
  Administered 2023-10-15: 5 mL
  Filled 2023-10-15: qty 5

## 2023-10-15 MED ORDER — COCONUT OIL OIL: 1.0000 | TOPICAL_OIL | Status: DC | PRN

## 2023-10-15 MED ORDER — DIPHENHYDRAMINE HCL 25 MG PO CAPS: 25.0000 mg | ORAL_CAPSULE | Freq: Four times a day (QID) | ORAL | Status: DC | PRN

## 2023-10-15 MED ORDER — DIBUCAINE (PERIANAL) 1 % EX OINT: 1.0000 | TOPICAL_OINTMENT | CUTANEOUS | Status: DC | PRN

## 2023-10-15 MED ORDER — OXYTOCIN 10 UNIT/ML IJ SOLN
INTRAMUSCULAR | Status: AC
  Administered 2023-10-15: 10 [IU]
  Filled 2023-10-15: qty 1

## 2023-10-15 MED ORDER — SODIUM CHLORIDE 0.9% FLUSH
3.0000 mL | Freq: Two times a day (BID) | INTRAVENOUS | Status: DC
Start: 2023-10-15 — End: 2023-10-16

## 2023-10-15 MED ORDER — ACETAMINOPHEN 325 MG PO TABS: 650.0000 mg | ORAL_TABLET | ORAL | Status: DC | PRN

## 2023-10-15 MED ORDER — PRENATAL MULTIVITAMIN CH
1.0000 | ORAL_TABLET | Freq: Every day | ORAL | Status: DC
  Administered 2023-10-16: 1 via ORAL
  Filled 2023-10-15: qty 1

## 2023-10-15 MED ORDER — SENNOSIDES-DOCUSATE SODIUM 8.6-50 MG PO TABS
2.0000 | ORAL_TABLET | ORAL | Status: DC
Start: 2023-10-15 — End: 2023-10-16
  Administered 2023-10-15: 2 via ORAL
  Filled 2023-10-15: qty 2

## 2023-10-15 MED ORDER — SIMETHICONE 80 MG PO CHEW: 80.0000 mg | CHEWABLE_TABLET | ORAL | Status: DC | PRN

## 2023-10-15 MED ORDER — SODIUM CHLORIDE 0.9% FLUSH
10.0000 mL | Freq: Two times a day (BID) | INTRAVENOUS | Status: DC
Start: 2023-10-15 — End: 2023-10-16

## 2023-10-15 MED ORDER — BENZOCAINE-MENTHOL 20-0.5 % EX AERO
1.0000 | INHALATION_SPRAY | CUTANEOUS | Status: DC | PRN
  Administered 2023-10-15: 1 via TOPICAL
  Filled 2023-10-15: qty 56

## 2023-10-15 MED ORDER — SODIUM CHLORIDE 0.9% FLUSH: 3.0000 mL | INTRAVENOUS | Status: DC | PRN

## 2023-10-15 MED ORDER — FENTANYL CITRATE (PF) 100 MCG/2ML IJ SOLN
INTRAMUSCULAR | Status: AC
  Administered 2023-10-15: 100 ug
  Filled 2023-10-15: qty 2

## 2023-10-15 MED ORDER — ZOLPIDEM TARTRATE 5 MG PO TABS: 5.0000 mg | ORAL_TABLET | Freq: Every evening | ORAL | Status: DC | PRN

## 2023-10-15 MED ORDER — ONDANSETRON HCL 4 MG PO TABS: 4.0000 mg | ORAL_TABLET | ORAL | Status: DC | PRN

## 2023-10-15 MED ORDER — WITCH HAZEL-GLYCERIN EX PADS: 1.0000 | MEDICATED_PAD | CUTANEOUS | Status: DC | PRN

## 2023-10-15 MED ORDER — OXYCODONE HCL 5 MG PO TABS: 5.0000 mg | ORAL_TABLET | ORAL | Status: DC | PRN

## 2023-10-15 MED ORDER — ONDANSETRON HCL 4 MG/2ML IJ SOLN: 4.0000 mg | INTRAMUSCULAR | Status: DC | PRN

## 2023-10-15 MED ORDER — MEASLES, MUMPS & RUBELLA VAC IJ SOLR: 0.5000 mL | Freq: Once | INTRAMUSCULAR | Status: DC

## 2023-10-15 MED ORDER — TETANUS-DIPHTH-ACELL PERTUSSIS 5-2.5-18.5 LF-MCG/0.5 IM SUSY: 0.5000 mL | PREFILLED_SYRINGE | Freq: Once | INTRAMUSCULAR | Status: DC

## 2023-10-15 NOTE — MAU Note (Addendum)
Emergency Phone Call received at 1248. This RN, Angelica Pou, RN, and Wyn Forster, MD, presented to the front entrance immediately. RROB called by this RN on way to the entrance.  Patient in the car stating her water broke five minutes and she was having CTX's and felt the need to push. Burch, RN, ran to get stretcher. This RN assisted patient's pants down. No crowning noted. RROB present. Patient reports no issues with her pregnancy. She reports this will be her third vaginal delivery. She reports her CTX's began around 1000 this morning.  Stretcher arrived and this RN assisted transport of patient to stretcher. Patient brought to room 1S20. Patient reports she needs to push. Morris, Charity fundraiser, performed cervical exam. Patient found to be complete.   This RN called L&D charge RN for report and bed request. Room 216 given. Patient got through door and began involuntarily pushing. Patient brought back into room. Dr. Marisue Humble, MD, at bedside for delivery. Patient delivered female infant at 24.

## 2023-10-15 NOTE — H&P (Addendum)
OBSTETRIC ADMISSION HISTORY AND PHYSICAL  Shelby Acevedo is a 22 y.o. female 1967 with IUP at [redacted]w[redacted]d by LMP c/w 7 week [place] presenting for precipitous labor. She is having strong contractions and had SROM in the car.  She plans on breast and bottle feeding. She request nexplanon or depo for birth control. 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 A redraw is not recommended.  Fetal risk for 22q11.2 deletion syndrome is 50% based on inheritance pattern, not assay data. Fetal diagnostic testing with a microarray may be considered. Parents elected not to pursue further antenatal testing.   Past Medical History: Past Medical History:  Diagnosis Date   Pregnancy induced hypertension    Scoliosis     Past Surgical History: Past Surgical History:  Procedure Laterality Date   TYMPANOSTOMY TUBE PLACEMENT     WISDOM TOOTH EXTRACTION      Obstetrical History: OB History     Gravida  5   Para  2   Term  2   Preterm  0   AB  2   Living  2      SAB  2   IAB  0   Ectopic  0   Multiple      Live Births  2           Social History Social History   Socioeconomic History   Marital status: Significant Other    Spouse name: Not on file   Number of children: Not on file   Years of education: Not on file   Highest education level: Not on file  Occupational History   Not on file  Tobacco Use   Smoking status: Never   Smokeless tobacco: Never  Vaping Use   Vaping status: Former  Substance and Sexual Activity   Alcohol use: No   Drug use: No   Sexual activity: Yes    Birth control/protection: None  Other Topics Concern   Not on file  Social History  Narrative   ** Merged History Encounter **       Social Drivers of Health   Financial Resource Strain: Low Risk  (02/05/2023)   Overall Financial Resource Strain (CARDIA)    Difficulty of Paying Living Expenses: Not very hard  Food Insecurity: No Food Insecurity (02/05/2023)   Hunger Vital Sign    Worried About Running Out of Food in the Last Year: Never true    Ran Out of Food in the Last Year: Never true  Transportation Needs: No Transportation Needs (02/05/2023)   PRAPARE - Administrator, Civil Service (Medical): No    Lack of Transportation (Non-Medical): No  Physical Activity: Inactive (02/05/2023)   Exercise Vital Sign    Days of Exercise per Week: 0 days    Minutes of Exercise per Session: 0 min  Stress: Stress Concern Present (02/05/2023)   [employer] of Occupational Health - Occupational Stress Questionnaire    Feeling of Stress : To some extent  Social Connections: Moderately Isolated (02/05/2023)   Social Connection and Isolation Panel [NHANES]    Frequency of Communication with Friends and Family: Three times a week  Frequency of Social Gatherings with Friends and Family: Never    Attends Religious Services: Never    Database administrator or Organizations: No    Attends Engineer, structural: Never    Marital Status: Living with partner    Family History: Family History  Problem Relation Age of Onset   Stroke Mother    Hypertension Mother    Congestive Heart Failure Mother    Thyroid disease Mother    Diabetes Paternal Uncle    Diabetes Maternal Grandmother    Alzheimer's disease Maternal Grandfather     Allergies: No Known Allergies  Medications Prior to Admission  Medication Sig Dispense Refill Last Dose/Taking   aspirin EC 81 MG tablet Take 2 tablets (162 mg total) by mouth daily. 60 tablet 6    Blood Pressure Monitor MISC For regular home bp monitoring during pregnancy (Patient not taking: Reported on 10/12/2023) 1 each 0     calcium carbonate (TUMS EX) 750 MG chewable tablet Chew 2 tablets by mouth 3 (three) times daily.      ferrous gluconate (FERGON) 324 MG tablet Take 1 tablet (324 mg total) by mouth daily with breakfast. 30 tablet 3    omeprazole (PRILOSEC) 20 MG capsule Take 1 capsule (20 mg total) by mouth daily. 1 tablet a day 30 capsule 6    ondansetron (ZOFRAN-ODT) 4 MG disintegrating tablet Take 1 tablet (4 mg total) by mouth every 6 (six) hours as needed for nausea. (Patient not taking: Reported on 10/12/2023) 30 tablet 2    prenatal vitamin w/FE, FA (PRENATAL 1 + 1) 27-1 MG TABS tablet Take 1 tablet by mouth daily at 12 noon. 30 tablet 12      Review of Systems   All systems reviewed and negative except as stated in HPI  Blood pressure 128/70, pulse 98, temperature 97.9 F (36.6 C), temperature source Oral, resp. rate 18, last menstrual period 01/02/2023, SpO2 98%, not currently breastfeeding. General appearance:  in active labor, does appear in moderate distress Cardiovascular exam limited by active labor and patient condition Pelvic: Normal external genitalia  Extremities: No sign of DVT on exam Presentation: cephalic Fetal monitoring Monitors not placed prior to delivery due to emergent condition Uterine activity Spontaneously contracting      Prenatal labs: ABO, Rh: A/Positive/-- (05/30 1221) Antibody: Negative (09/05 0812) Rubella: <0.90 (05/30 1221) RPR: Non Reactive (09/05 0812)  HBsAg: Negative (05/30 1221)  HIV: Non Reactive (09/05 1610)  GBS: Negative/-- (11/14 0311)  1 hr Glucola WNL Genetic screening  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 A redraw is not recommended.  Fetal risk for 22q11.2 deletion syndrome is 50% based on inheritance pattern, not assay data. Fetal diagnostic testing with a microarray may be considered.  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 A redraw is not recommended.  Fetal risk for 22q11.2 deletion syndrome is 50% based on inheritance pattern, not assay data. Fetal diagnostic testing with a microarray may be considered. Parents elected not to pursue further antenatal testing.  Maternal Ultrasounds/Referrals: Normal Fetal Ultrasounds or other Referrals:  None Maternal Substance Abuse:  No Significant Maternal Medications:  None Significant Maternal Lab Results:  Group B Strep negative Number of Prenatal Visits:greater than 3 verified prenatal visits Other Comments:   none  No  results found for this or any previous visit (from the past 24 hours).  Patient Active Problem List   Diagnosis Date Noted   Rubella non-immune status, antepartum 04/06/2023   Encounter for supervision of normal pregnancy, antepartum 04/02/2023   History of pre-eclampsia in prior pregnancy, currently pregnant 02/05/2023    Assessment/Plan:  Shelby Acevedo is a 22 y.o. [id number] at [redacted]w[redacted]d here for precipitious labor. See MAU note and Delivery note for more information.   #Labor:Spontaneous and Precipitous #Pain: No analgesia given precipitous delivery #FWB: Well-appearing infant  #ID:  GBS neg  #MOF: Breast and bottle  #MOC:Nexplanon vs Depo  #Circ:  No  Eliezer Mccoy, MD  10/15/2023, 1:24 PM   GME ATTESTATION:  Evaluation and management procedures were performed by the [facility] Resident under my supervision. I was immediately available for direct supervision, assistance and direction throughout this encounter.  I also confirm that I have verified the information documented in the resident's note, and that I have also personally reperformed the pertinent components of the physical exam and all of the medical decision making activities.  I have also made any necessary editorial changes.  Wyn Forster, MD OB Fellow,  Faculty Practice [facility] 10/15/2023 3:41 PM

## 2023-10-15 NOTE — MAU Provider Note (Signed)
Called to the front entrance for "emergency"  Found patient in the car contracting, complaining that her water just broke and baby is "coming"  Transferred to stretcher and brought into MAU SVE: complete, +1  Third baby, started contracting a few hours ago.  Denies complications this pregnancy Having a boy Was scheduled for IOL tonight  See admission and delivery note for further details.

## 2023-10-16 ENCOUNTER — Inpatient Hospital Stay (HOSPITAL_COMMUNITY): Payer: Medicaid Other

## 2023-10-16 ENCOUNTER — Inpatient Hospital Stay (HOSPITAL_COMMUNITY): Admission: RE | Admit: 2023-10-16 | Payer: Medicaid Other | Source: Home / Self Care | Admitting: Family Medicine

## 2023-10-16 LAB — CBC
HCT: 25.6 % — ABNORMAL LOW (ref 36.0–46.0)
Hemoglobin: 8.3 g/dL — ABNORMAL LOW (ref 12.0–15.0)
MCH: 27.4 pg (ref 26.0–34.0)
MCHC: 32.4 g/dL (ref 30.0–36.0)
MCV: 84.5 fL (ref 80.0–100.0)
Platelets: 196 10*3/uL (ref 150–400)
RBC: 3.03 MIL/uL — ABNORMAL LOW (ref 3.87–5.11)
RDW: 14.7 % (ref 11.5–15.5)
WBC: 11.6 10*3/uL — ABNORMAL HIGH (ref 4.0–10.5)
nRBC: 0 % (ref 0.0–0.2)

## 2023-10-16 LAB — RPR: RPR Ser Ql: NONREACTIVE

## 2023-10-16 MED ORDER — MEDROXYPROGESTERONE ACETATE 150 MG/ML IM SUSP: 150.0000 mg | Freq: Once | INTRAMUSCULAR | Status: DC

## 2023-10-16 MED ORDER — IBUPROFEN 600 MG PO TABS: 600.0000 mg | ORAL_TABLET | Freq: Four times a day (QID) | ORAL | 0 refills | Status: DC

## 2023-10-16 MED ORDER — ACETAMINOPHEN 325 MG PO TABS: 650.0000 mg | ORAL_TABLET | ORAL | Status: DC | PRN

## 2023-10-16 NOTE — Discharge Summary (Signed)
Postpartum Discharge Summary   Patient Name: Shelby Acevedo DOB: [birth year] MRN: [id number]  Date of admission: 10/15/2023 Delivery date:10/15/2023 Delivering provider: Darnelle Spangle B Date of discharge: 10/16/2023  Admitting diagnosis: NSVD (normal spontaneous vaginal delivery) [O80] Intrauterine pregnancy: [redacted]w[redacted]d     Secondary diagnosis:  Principal Problem:   NSVD (normal spontaneous vaginal delivery)  Additional problems: precipitous delivery    Discharge diagnosis: Term Pregnancy Delivered                                              Post partum procedures: Augmentation: none Complications: None  Hospital course: Onset of Labor With Vaginal Delivery      22 y.o. yo [id number] at [redacted]w[redacted]d was admitted in Active Labor on 10/15/2023. Labor course was complicated by precipitous delivery  Membrane Rupture Time/Date: 12:35 PM,10/15/2023  Delivery Method:Vaginal, Spontaneous Operative Delivery:N/A Episiotomy: None Lacerations:  1st degree Patient had a postpartum course complicated by nothing.  She is ambulating, tolerating a regular diet, passing flatus, and urinating well. Patient is discharged home in stable condition on 10/16/23.  Newborn Data: Birth date:10/15/2023 Birth time:12:57 PM Gender:Female Living status:Living Apgars:8 ,9  Weight:7 lb 6.3 oz (3.355 kg)  Magnesium Sulfate received: No BMZ received: No Rhophylac:N/A ZHY:QMVHQIO T-DaP:Given prenatally Flu: N/A RSV Vaccine received: No Transfusion:No  Immunizations received: Immunization History  Administered Date(s) Administered   Influenza, Seasonal, Injecte, Preservative Fre 08/20/2023   Tdap 07/23/2023    Physical exam  Vitals:   10/15/23 1610 10/15/23 2007 10/15/23 2350 10/16/23 0447  BP: 125/77 112/69 116/82 116/74  Pulse: 68 74 68 73  Resp: 17 16    Temp: 98 F (36.7 C) 97.9 F (36.6 C) 97.8 F (36.6 C) 98.5 F (36.9 C)  TempSrc: Oral Oral Oral Oral  SpO2: 99% 98% 100% 99%   General: alert,  cooperative, and no distress Lochia: appropriate Uterine Fundus: firm Incision: N/A DVT Evaluation: No evidence of DVT seen on physical exam. Negative Homan's sign. No cords or calf tenderness. No significant calf/ankle edema. Labs: Lab Results  Component Value Date   WBC 11.6 (H) 10/16/2023   HGB 8.3 (L) 10/16/2023   HCT 25.6 (L) 10/16/2023   MCV 84.5 10/16/2023   PLT 196 10/16/2023      Latest Ref Rng & Units 04/02/2023   12:21 PM  CMP  Glucose 70 - 99 mg/dL 82   BUN 6 - 20 mg/dL 8   Creatinine 9.62 - 9.52 mg/dL 8.41   Sodium 324 - 401 mmol/L 138   Potassium 3.5 - 5.2 mmol/L 4.1   Chloride 96 - 106 mmol/L 101   CO2 20 - 29 mmol/L 21   Calcium 8.7 - 10.2 mg/dL 9.8   Total Protein 6.0 - 8.5 g/dL 7.5   Total Bilirubin 0.0 - 1.2 mg/dL 0.2   Alkaline Phos 44 - 121 IU/L 81   AST 0 - 40 IU/L 12   ALT 0 - 32 IU/L 9    Edinburgh Score:    10/16/2023   11:17 AM  Edinburgh Postnatal Depression Scale Screening Tool  I have been able to laugh and see the funny side of things. 0  I have looked forward with enjoyment to things. 0  I have blamed myself unnecessarily when things went wrong. 0  I have been anxious or worried for no good reason. 0  I have felt scared or panicky  for no good reason. 0  Things have been getting on top of me. 0  I have been so unhappy that I have had difficulty sleeping. 0  I have felt sad or miserable. 0  I have been so unhappy that I have been crying. 0  The thought of harming myself has occurred to me. 0  Edinburgh Postnatal Depression Scale Total 0   Edinburgh Postnatal Depression Scale Total: 0   After visit meds:  Allergies as of 10/16/2023   No Known Allergies      Medication List     STOP taking these medications    aspirin EC 81 MG tablet   Blood Pressure Monitor Misc   calcium carbonate 750 MG chewable tablet Commonly known as: TUMS EX   ferrous gluconate 324 MG tablet Commonly known as: FERGON   omeprazole 20 MG  capsule Commonly known as: PRILOSEC   ondansetron 4 MG disintegrating tablet Commonly known as: ZOFRAN-ODT       TAKE these medications    acetaminophen 325 MG tablet Commonly known as: Tylenol Take 2 tablets (650 mg total) by mouth every 4 (four) hours as needed (for pain scale < 4).   ibuprofen 600 MG tablet Commonly known as: ADVIL Take 1 tablet (600 mg total) by mouth every 6 (six) hours.   prenatal vitamin w/FE, FA 27-1 MG Tabs tablet Take 1 tablet by mouth daily at 12 noon.       Discharge home in stable condition Infant Feeding: Breast Infant Disposition:home with mother Discharge instruction: per After Visit Summary and Postpartum booklet. Activity: Advance as tolerated. Pelvic rest for 6 weeks.  Diet: routine diet Future Appointments:No future appointments. Follow up Visit:   Please schedule this patient for a Virtual postpartum visit in 4 weeks with the following provider: Any provider. Additional Postpartum F/U:  Low risk pregnancy complicated by:  Delivery mode:  Vaginal, Spontaneous Anticipated Birth Control:  PP Depo given  Pt seen and discharged by [name], DNP, CNM; reviewed and final discharge completed by  Edd Arbour, CNM, MSN, IBCLC Certified Nurse Midwife, [facility]

## 2023-10-24 ENCOUNTER — Telehealth (HOSPITAL_COMMUNITY): Payer: Self-pay

## 2023-10-24 NOTE — Telephone Encounter (Signed)
10/24/2023 [id number]  Name: Shelby Acevedo MRN: [id number] DOB: 03-22-2001  Reason for Call:  Transition of Care Hospital Discharge Call  Contact Status: Patient Contact Status: Message  Language assistant needed:          Follow-Up Questions:    Inocente Salles Postnatal Depression Scale:  In the Past 7 Days:    PHQ2-9 Depression Scale:     Discharge Follow-up:    Post-discharge interventions: NA  Signature  Signe Colt

## 2023-11-30 ENCOUNTER — Ambulatory Visit: Payer: Medicaid Other | Admitting: Women's Health

## 2023-12-08 ENCOUNTER — Ambulatory Visit: Payer: Medicaid Other | Admitting: Medical Genetics

## 2023-12-08 VITALS — BP 108/73 | Ht 64.0 in | Wt 172.9 lb

## 2023-12-08 DIAGNOSIS — O285 Abnormal chromosomal and genetic finding on antenatal screening of mother: Secondary | ICD-10-CM | POA: Diagnosis not present

## 2023-12-08 DIAGNOSIS — O289 Unspecified abnormal findings on antenatal screening of mother: Secondary | ICD-10-CM | POA: Insufficient documentation

## 2023-12-08 DIAGNOSIS — Z3A00 Weeks of gestation of pregnancy not specified: Secondary | ICD-10-CM

## 2023-12-08 NOTE — Progress Notes (Signed)
 GENETIC COUNSELING NEW PATIENT EVALUATION Patient name: Shelby Acevedo DOB: 01-08-2001 Age: 23 y.o. MRN: [id number]  Referring Provider/Specialty: Bebe Furry, MD  Date of Evaluation: 12/08/2023 Chief Complaint/Reason for Referral: High-risk prenatal screening   Brief Summary: Shelby Acevedo is a 23 y.o. 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 She is accompanied by her mother at today's visit.  Prior genetic testing has not been performed.  This session was conducted with a genetic counseling student, Shelby Acevedo, present.  Family History: See pedigree obtained during today's visit under History->Family->Pedigree.  The family history was notable for the following: Son, 29 yo, with a heart murmur, speech delay, and suspected ADHD. Son, 2 yo, with a speech delay.  He is not talking yet. 2 early miscarriages with the same partner as her other children.  Paternal Family History  There is limited information available regarding the paternal family history.  Maternal Family History Mother, 24 yo, with bilateral hearing loss possibly related to repeated tube placement, hypertension, heart failure, and anxiety. Maternal half-brother, 42 yo, with ear tubes. Maternal half-brother, 61 yo, with ADHD. First cousin, female, 51 yo, with a congenital heart defect. Grandfather, deceased from stomach cancer. History of smoking. Grandmother, hypertension and anxiety.  Mother's ethnicity: Mixed European Father's ethnicity: Mixed European Consangunity: Denies   Prior Genetic testing: None  Genetic Counseling: Shelby Acevedo is a 23 y.o. female with a personal history of abnormal prenatal screening.  During Shelby Acevedo's most recent pregnancy cfDNA screening was performed that identified an increased risk for a maternal 22q11.2 deletion.  cfDNA screening is a  type of non-invasive prenatal screening (NIPS) that analyzes placental and maternal DNA fragments found in the maternal blood.  Due to the increased maternal risk for 22q11.2 deletion syndrome, risk for the fetus was estimated at 50%.  Shelby Acevedo did not receive diagnostic testing for herself, or the pregnancy, and presents to clinic today for consideration of such testing now that her son has been born.  Shelby Acevedo has a personal history of speech delay.  It is thought that this may be related to chronic ear infections and repeated tympanostomy rather than a truly developmental concern.  She was also diagnosed with ADHD as a child.  She graduated from high school but required modified math courses to meet graduation requirements.  Currently, she works as a Teacher's Aide.  She has not had any cardiac screening to date.  There is a family history of ADHD in Shelby Acevedo's maternal half-brother and suspected ADHD, pending evaluation in her 58 yo son.  Additionally, Shelby Acevedo, and her two older sons, 20 yo and 2 yo, all had a speech delay.  Her 2 yo son is not yet talking.  Shelby Acevedo, her mother, and one of her maternal half-brothers required tympanostomy.  The genetic etiology of 22q11.2 deletion syndrome was discussed with the family including that the condition is caused by a deletion of chromosomal material on chromosome 22.  In the majority of cases, the deletion is de novo in an individual, meaning it is not inherited from either parent.  For those found to have 22q11.2 deletion syndrome, there is a 50% chance that their child will also inherit the condition.    Give Shelby Acevedo's high-risk prenatal screening for a maternal 22q11.2 deletion, it is reasonable to consider genetic testing, specifically chromosomal microarray.  Chromosomal Microarray (CMA) is used to detect small missing  or extra pieces of genetic information (chromosomal microdeletions or microduplications). The test will analyze  chromosome 22 for any deletions as well as the other chromosomes.  Shelby Acevedo is interested in this testing. Verbal and written informed consent was give following discussion of the risks, benefits, expected cost, and timeline.  A buccal sample was obtained from Shelby Acevedo and her son in clinic.  Once Shelby Acevedo's results are available (typically in 2-4 weeks), we will call the the family to review the results and discuss next steps, as indicated.   Recommendations: [facility] Chromosomal Microarray Continue follow-up with other healthcare providers as recommended  Date: 12/08/2023 Total time spent: 75 minutes Genetic Counselor-only time: 30 minutes  Time spent includes face to face and non-face to face care for the patient on the date of this encounter (history, genetic counseling, coordination of care, data gathering and/or documentation as outlined).   Lum Molt MS [facility] Certified Genetic Counselor [facility] [employer]

## 2023-12-08 NOTE — Patient Instructions (Signed)
 Recommendations: Chromosomal microarray through [facility] - results expected in 1 month.  Continue follow up with current medical providers per their recommendations.   Follow up will be based on the results of the testing.  Thank you for allowing us  to be a part of your care. Please let us  know if there is anything else you need from us .  [facility]

## 2023-12-08 NOTE — Progress Notes (Signed)
 MEDICAL GENETICS NEW PATIENT EVALUATION  Patient name: Shelby Acevedo DOB: 25-Apr-2001 Age: 23 y.o. 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 The symptoms of this condition are variable, and in some patients these can be very mild. 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 Shelby Acevedo did not have any confirmatory testing or genetic counseling during her recent pregnancy. In order to further determine if she carries the 22q11.2 deletion, a chromosomal microarray is recommended. Consent and samples were obtained for testing through [facility], with results expected in 1 month. If positive, additional recommendations will be made.  Recommendations: Chromosomal microarray through [facility] - results expected in 1 month.  Continue follow up with current medical providers per their recommendations.  Follow up will be based on the results of the testing.   HPI: Shelby Acevedo is a 23 y.o. 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 She is accompanied by her mother, who helped provid the history. This information, along with a review of pertinent records, labs, and radiology studies, is summarized below.  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 She had speech therapy throughout her time in  school.   She recently delivered a female who is doing well. She also has 2 prior children that are also doing well. She has 2 early miscarriages. 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 She did not have any confirmatory testing or meet with a genetic counselor during the pregnancy. It is unknown if her newborn son could also be affected, and he was referred to genetics clinic and seen today as well.    Past Medical History: Past Medical History:  Diagnosis Date   Pregnancy induced hypertension    Scoliosis    Patient Active Problem List   Diagnosis Date Noted   NSVD (normal spontaneous vaginal delivery) 10/15/2023   Rubella non-immune status, antepartum 04/06/2023   Encounter for supervision of normal pregnancy, antepartum 04/02/2023   History of pre-eclampsia in prior pregnancy, currently pregnant 02/05/2023   Past Surgical History:  Past Surgical History:  Procedure Laterality Date   TYMPANOSTOMY TUBE PLACEMENT     WISDOM TOOTH EXTRACTION     Developmental History: Milestones -- speech delay and articulation differences Therapies -- speech School -- completed HS  Medications: Current Outpatient Medications on File Prior to Visit  Medication Sig Dispense Refill   acetaminophen  (TYLENOL ) 325 MG tablet Take 2 tablets (650 mg total) by mouth every 4 (four) hours as needed (for pain scale < 4).     ibuprofen  (ADVIL ) 600 MG tablet Take 1 tablet (600 mg total) by mouth every 6 (six) hours. 30 tablet 0   prenatal vitamin w/FE, FA (PRENATAL 1 + 1) 27-1 MG TABS tablet Take 1 tablet by mouth daily at 12 noon. 30 tablet  12   No current facility-administered medications on file prior to visit.   Allergies:  No Known Allergies  Immunizations: Up to date  Review of Systems: Negative except as noted in the HPI, ho history of nasal reflux  Family History: Family History  Problem  Relation Age of Onset   Stroke Mother    Hypertension Mother    Congestive Heart Failure Mother    Thyroid disease Mother    Diabetes Paternal Uncle    Diabetes Maternal Grandmother    Alzheimer's disease Maternal Grandfather   Self-reported ancestry: Mixed European Consanguinity: Denies Please see the dentist note for additional information  Social History: Lives with family in [place]  Vitals: Weight: 172.9 lb  Height: 5'4  Head circumference: 54.5 cm  Genetics Physical Exam:  Constitution: The patient is active and alert  Head: No abnormalities detected in: head, hairline, shape or size    Anterior fontanelle flat: not flat    Anterior fontanelle open: not open    Bitemporal narrowing: forehead not narrow    Frontal bossing: no frontal bossing    Macrocephaly: not macrocephalic    Microcephaly: not microcephalic    Plagiocephaly: not plagiocephalic  Face: No abnormalities detected in: face, midface or shape    Coarse facial features: no coarse facies    Midfacial hypoplasia: no midfacial hypoplasia  Eyes: No abnormalities detected in: eyebrows, irises, eyelashes or pupils    Upslanting palpebral fissure: upslanting palpebral fissure (comments: Hooding of right upper eyelis)  Ears: (comments: Overfolded helices)  Nose: No abnormalities detected in: nose, nasal bridge or nasal tip    Bulbous nasal tip: no prominent nasal tip    Columella below nares: no columella below nares    Depressed nasal bridge: no depressed nasal bridge    Flat nasal bridge: no flat nasal bridge    Hypoplastic alae nasi: nasal alae not underdeveloped     Upturned nasal tip: non-upturned nasal tip  Mouth: No abnormalities detected in: mouth, palate, lips, teeth or tongue    High-arched palate: palate not high arched    Micrognathia: no micrognathia    Smooth philtrum: non-smooth philtrum    Thin upper lip vermilion: non-thin upper lip vermilion Teeth:    Abnormal shape:  normal morphology     Discolored: normal color     Misaligned: no misalignment of teeth  (comments: Normal palate movement, no bifid uvula)  Neck: No abnormalities detected in: neck    Cysts: no cysts    Pits: no pits in neck    Redundant nuchal skin: no redundant neck skin    Webbing: no webbed neck  Chest: No abnormalities detected in: chest, appearance, clavicles or scapulae    Inverted nipples: nipples not inverted    Pectus excavatum: no pectus excavatum  Cardiac: No abnormalities detected in: cardiovascular system    Abnormal distal perfusion: normal distal perfusion    Irregular rate: heart rate regular    Irregular rhythm: regular rhythm    Murmur: no murmur  Lungs: No abnormalities detected in: pulmonary system, bilateral auscultation or effort  Abdomen: No abnormalities detected in: abdomen or appearance    Abnormal umbilicus: normal umbilicus    Diastasis recti: no diastasis recti    Distended abdomen: no distension    Hepatosplenomegaly: no hepatosplenomegaly    Umbilical hernia: no umbilical hernia  Spine:    Scoliosis: no scoliosis (comments: Carries hips and shoulders evenly)  Neurological: No abnormalities detected in: neurological system, deep tendon reflexes, antigravity movement of  extremities, strength, facial movement or tone    Hypertonia: not hypertonic    Hypotonia: not hypotonic  Genitourinary: not assessed  Hair, Nails, and Skin: No abnormalities detected in: integumentary system, hair, nails or skin    Abnormally healed scars: no abnormally healed scars    Birthmarks: no birthmarks    Lesions: no lesions  Extremities: No abnormalities detected in: extremities    Asymmetric girth: symmetric girth    Contractures: no joint contractures    Limited range of motion: non-limited ROM  Hands and Feet: No abnormalities detected in: distal extremities    Clinodactyly: no clinodactyly    Polydactyly: no polydactyly    Single palmar  crease: multiple palmar creases    Syndactyly: no syndactyly   Photo of patient available (verbal consent obtained)   Anadia Helmes Haldeman-Englert, MD [facility] Date: 12/08/2023 Time: 1400   Total time spent: 60 minutes Time spent includes face to face and non-face to face care for the patient on the date of this encounter (history and physical, genetic counseling, coordination of care, data gathering and/or documentation as outlined).  Genetic counselor: Lum Molt, MS, [facility]

## 2023-12-14 ENCOUNTER — Encounter: Payer: Self-pay | Admitting: Women's Health

## 2023-12-17 ENCOUNTER — Ambulatory Visit: Payer: Medicaid Other | Admitting: Advanced Practice Midwife

## 2023-12-21 ENCOUNTER — Ambulatory Visit: Payer: Medicaid Other | Admitting: Advanced Practice Midwife

## 2023-12-21 ENCOUNTER — Other Ambulatory Visit (HOSPITAL_COMMUNITY)
Admission: RE | Admit: 2023-12-21 | Discharge: 2023-12-21 | Disposition: A | Discharge status: Home / Self Care | Payer: Medicaid Other | Source: Ambulatory Visit | Attending: Advanced Practice Midwife | Admitting: Advanced Practice Midwife

## 2023-12-21 ENCOUNTER — Encounter: Payer: Self-pay | Admitting: Advanced Practice Midwife

## 2023-12-21 DIAGNOSIS — Z124 Encounter for screening for malignant neoplasm of cervix: Secondary | ICD-10-CM | POA: Insufficient documentation

## 2023-12-21 DIAGNOSIS — Z30013 Encounter for initial prescription of injectable contraceptive: Secondary | ICD-10-CM | POA: Diagnosis not present

## 2023-12-21 DIAGNOSIS — Z3202 Encounter for pregnancy test, result negative: Secondary | ICD-10-CM | POA: Diagnosis not present

## 2023-12-21 LAB — POCT URINE PREGNANCY: Preg Test, Ur: NEGATIVE

## 2023-12-21 MED ORDER — MEDROXYPROGESTERONE ACETATE 150 MG/ML IM SUSY
150.0000 mg | PREFILLED_SYRINGE | Freq: Once | INTRAMUSCULAR | Status: AC
  Administered 2023-12-21: 150 mg via INTRAMUSCULAR

## 2023-12-21 MED ORDER — MEDROXYPROGESTERONE ACETATE 150 MG/ML IM SUSP: 150.0000 mg | INTRAMUSCULAR | 3 refills | Status: DC

## 2023-12-21 NOTE — Patient Instructions (Signed)
 Birth Control Shot: What to Expect A birth control shot prevents pregnancy. A birth control shot is put in (injected) into the skin or a muscle. The shot contains the hormone progestin. Hormones are chemicals that affect how the body works. Progestin prevents pregnancy because it: Stops the ovaries from releasing eggs. Makes cervical mucus thicker. This prevents sperm from getting into the cervix. The cervix is the lowest part of the uterus. Thins the lining of the uterus to prevent a fertilized egg from attaching to the uterus. Tell a health care provider about: Any allergies you have. All medicines you take. These include vitamins, herbs, eye drops, and creams. Any bleeding problems you have. Any medical problems you have. Whether you're pregnant or may be pregnant. What are the risks? Your health care provider will talk with you about risks. These may include: Mood changes or depression. Your bones becoming thinner or weaker. This is called loss of bone density. This can happen if you get the shots for a long period of time. This can cause your bones to break. Blood clots. These are rare. A higher risk of an egg being fertilized outside your uterus, called an ectopic pregnancy.This is rare. What happens before? Your provider may do a physical exam. You may have a test to make sure you aren't pregnant. What happens during a birth control shot?  The area where the shot will be given will be cleaned. A needle will be put into a muscle in your upper arm or butt, or into the skin of your thigh or belly. The needle will be put on a syringe with the medicine in it. The medicine will be pushed through the syringe into your body. A small bandage may be put over the place where the shot was given. What happens after? After the shot, it's common to have: Soreness around the place where the shot was given for a couple of days. Spotting or bleeding between periods. Weight gain. Tender  breasts. Headaches. Belly pain. Ask your provider if you need to use an added method of birth control, such as a condom, sponge, or spermicide. If the first shot is given 1-7 days after the start of your last period, you won't need to use an added method of birth control. If the first shot is given at any other time during your menstrual cycle, you'll need to use an added method of birth control for 7 days after you get the shot. Follow these instructions at home: General instructions Take your medicines only as told. Do not rub or massage the place where the shot was given. Track your periods. This will help you know if they become irregular. Always use a condom to protect against sexually transmitted infections (STIs). Make an appointment in time for your next shot and mark it on your calendar. You must get a shot every 3 months (12-13 weeks) to prevent pregnancy. Lifestyle Do not smoke, vape, or use nicotine or tobacco. Eat foods that are high in calcium and vitamin D, such as milk, cheese, and salmon. Calcium helps keep your bones strong and may help with any loss in bone density caused by the birth control shot. Ask your provider if you should take supplements. Contact a health care provider if you: Have discharge or bleeding from your vagina that isn't normal. Miss a period or think you might be pregnant. Have mood changes or depression. Feel dizzy or light-headed. Have leg pain. Get help right away if you: Have chest pain  or cough up blood. Have trouble breathing. Have a really bad headache that doesn't go away. Have numbness in any part of your body. Have really bad pain in your belly. Have slurred speech or vision problems. These symptoms may be an emergency. Call 911 right away. Do not wait to see if the symptoms will go away. Do not drive yourself to the hospital. This information is not intended to replace advice given to you by your health care provider. Make sure you  discuss any questions you have with your health care provider. Document Revised: 06/25/2023 Document Reviewed: 06/25/2023 Elsevier Patient Education  2024 [employer].

## 2023-12-21 NOTE — Addendum Note (Signed)
Addended by: Moss Mc on: 12/21/2023 03:21 PM   Modules accepted: Orders

## 2023-12-21 NOTE — Progress Notes (Signed)
Post Partum Visit Note   Chief Complaint:   Postpartum Care  History of Present Illness:   Shelby Acevedo is a 23 y.o. [phone]  female being seen today for a postpartum visit. She is 9 weeks postpartum following a spontaneous vaginal delivery at 40.6 gestational weeks. IOL: No, . Anesthesia: none.  Laceration: 1st degree.  Complications: none. Inpatient contraception: no.   Pregnancy uncomplicated. Tobacco use: no. Substance use disorder: no.  Next pap smear due: today Patient's last menstrual period was 01/02/2023.  Postpartum course has been uncomplicated. Bleeding no bleeding. Bowel function is normal. Bladder function is normal. Urinary incontinence? No, fecal incontinence? No Patient is sexually active. Last sexual activity: 1 week ago, used condoms .    Upstream - 12/21/23 1434       Pregnancy Intention Screening   Does the patient want to become pregnant in the next year? No    Does the patient's partner want to become pregnant in the next year? No    Would the patient like to discuss contraceptive options today? Yes      Contraception Wrap Up   Current Method Female Condom    End Method Hormonal Injection    Contraception Counseling Provided Yes            The pregnancy intention screening data noted above was reviewed. Potential methods of contraception were discussed. The patient elected to proceed with Hormonal Injection.  Edinburgh Postpartum Depression Screening: Negative  Edinburgh Postnatal Depression Scale - 12/21/23 1434       Edinburgh Postnatal Depression Scale:  In the Past 7 Days   I have been able to laugh and see the funny side of things. 0    I have looked forward with enjoyment to things. 0    I have blamed myself unnecessarily when things went wrong. 0    I have been anxious or worried for no good reason. 0    I have felt scared or panicky for no good reason. 0    Things have been getting on top of me. 0    I have been so unhappy that I have had  difficulty sleeping. 0    I have felt sad or miserable. 0    I have been so unhappy that I have been crying. 0    The thought of harming myself has occurred to me. 0    Edinburgh Postnatal Depression Scale Total 0            Baby's course has been uncomplicated. Baby is feeding by bottle. Infant has a pediatrician/family doctor? Yes.  Childcare strategy if returning to work/school: yes.  Pt has material needs met for her and baby: Yes.   Review of Systems:   Pertinent items are noted in HPI Denies Abnormal vaginal discharge w/ itching/odor/irritation, headaches, visual changes, shortness of breath, chest pain, abdominal pain, severe nausea/vomiting, or problems with urination or bowel movements. Pertinent History Reviewed:  Reviewed past medical,surgical, obstetrical and family history.  Reviewed problem list, medications and allergies. OB History  Gravida Para Term Preterm AB Living  5 3 3  0 2 3  SAB IAB Ectopic Multiple Live Births  2 0 0 0 3    # Outcome Date GA Lbr Len/2nd Weight Sex Type Anes PTL Lv  5 Term 10/15/23 [redacted]w[redacted]d 02:54 / 00:03 7 lb 6.3 oz (3.355 kg) M Vag-Spont None  LIV  4 Term 11/16/21 [redacted]w[redacted]d  6 lb 3 oz (2.807 kg) M Vag-Spont None N LIV  3 SAB 09/18/20          2 SAB 2021          1 Term 08/31/18 [redacted]w[redacted]d  6 lb 2 oz (2.778 kg) M Vag-Spont None N LIV     Complications: Mild pre-eclampsia   Physical Assessment:   Vitals:   12/21/23 1432  BP: 125/81  Pulse: 69  Weight: 175 lb (79.4 kg)  Height: 5\' 4"  (1.626 m)  Body mass index is 30.04 kg/m.  Objective:  Blood pressure 125/81, pulse 69, height 5\' 4"  (1.626 m), weight 175 lb (79.4 kg), last menstrual period 01/02/2023, not currently breastfeeding.  General:  alert, cooperative, and no distress   Breasts:  negative  Lungs: Normal respiratory effort  Heart:  regular rate and rhythm  Abdomen: soft, non-tender,    Vulva:  normal  Vagina: normal vagina  Cervix:  normal  Corpus: Well involuted  Adnexa:  not  evaluated  Rectal Exam: no hemorrhoids          No results found for this or any previous visit (from the past 24 hours).  Assessment & Plan:  1) Postpartum exam 2) 9 wks s/p spontaneous vaginal delivery 3) bottle feeding 4) Depression screening 5) Contraception management: Depo today  Essential components of care per ACOG recommendations:  1.  Mood and well being:  If positive depression screen, discussed and plan developed.  If using tobacco we discussed reduction/cessation and risk of relapse If current substance abuse, we discussed and referral to local resources was offered.   2. Infant care and feeding:  If breastfeeding, discussed returning to work, pumping, breastfeeding-associated pain, guidance regarding return to fertility while lactating if not using another method. If needed, patient was provided with a letter to be allowed to pump q 2-3hrs to support lactation in a private location with access to a refrigerator to store breastmilk.   Recommended that all caregivers be immunized for flu, pertussis and other preventable communicable diseases If pt does not have material needs met for her/baby, referred to local resources for help obtaining these.  3. Sexuality, contraception and birth spacing Provided guidance regarding sexuality, management of dyspareunia, and resumption of intercourse Discussed avoiding interpregnancy interval <38mths and recommended birth spacing of 18 months  4. Sleep and fatigue Discussed coping options for fatigue and sleep disruption Encouraged family/partner/community support of 4 hrs of uninterrupted sleep to help with mood and fatigue  5. Physical recovery  If pt had a C/S, assessed incisional pain and providing guidance on normal vs prolonged recovery If pt had a laceration, perineal healing and pain reviewed.  If urinary or fecal incontinence, discussed management and referred to PT or uro/gyn if indicated  Patient is safe to resume  physical activity. Discussed attainment of healthy weight.  6.  Chronic disease management Discussed pregnancy complications if any, and their implications for future childbearing and long-term maternal health. Review recommendations for prevention of recurrent pregnancy complications, such as aspirin to reduce risk of preeclampsia not applicable. Pt had GDM: No. If yes, 2hr GTT scheduled: not applicable. Reviewed medications and non-pregnant dosing including consideration of whether pt is breastfeeding using a reliable resource such as LactMed: not applicable Referred for f/u w/ PCP or subspecialist providers as indicated: not applicable  7. Health maintenance Mammogram at 25yo or earlier if indicated Pap smears as indicated  Meds:  Meds ordered this encounter  Medications   medroxyPROGESTERone (DEPO-PROVERA) 150 MG/ML injection    Sig: Inject 1 mL (150 mg total) into  the muscle every 3 (three) months.    Dispense:  1 mL    Refill:  3    Supervising Provider:   Lazaro Arms [[id number]]    Follow-up: Return for Depo in 11-12 weeks.   No orders of the defined types were placed in this encounter.      Jacklyn Shell DNP, CNM [facility] [employer], [facility] 12/21/2023 2:58 PM

## 2023-12-23 LAB — CYTOLOGY - PAP: Diagnosis: NEGATIVE

## 2023-12-30 ENCOUNTER — Telehealth: Payer: Self-pay | Admitting: Genetic Counselor

## 2023-12-30 NOTE — Progress Notes (Signed)
 Spoke with Ms. Shelby Acevedo regarding the results of her recent genetic testing.   Ms. 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  After evaluation, genetic testing was ordered for Ms. Shelby Acevedo including chromosomal microarray (CMA).   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  This deletion is significantly smaller than the deletion typically associated with 22q11.2 deletion syndrome and does not include the TBX1 gene, which is a critical gene for the condition.  These smaller deletions are referred to as central, or nested, deletions.   Individuals with central 22q11.2 deletions present with variable clinical findings including dysmorphic facial features, developmental delays, neural tube defects, kidney and other genitourinary tract anomalies, as well as psychiatric or behavior problems. Cardiovascular anomalies were observed less frequently (approximately 17%) in individuals with central 22q11.2 deletions as compared to reported frequencies in individuals with the common 22q11.2 deletions.  We discussed that Ms. Shelby Acevedo has had normal imaging of her kidneys in the past and is not known to have any cardiovascular concerns.  She may consider an echocardiogram, but was not interested at this time.  No other changes to medical management are recommended based on this result.  We discussed that there is a 50% chance that each of her children, including her current children and any future children, may also inherit the same deletion on chromosome 22.  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 Testing of her current children may be  considered.  Ms. Shelby Acevedo expressed understanding of these results and was encouraged to reach out with any further questions.  The test report has been released to the family and is attached to the associated order.   Tilda Franco, MS [facility] Certified Genetic Counselor

## 2024-01-12 DIAGNOSIS — Y9351 Activity, roller skating (inline) and skateboarding: Secondary | ICD-10-CM | POA: Insufficient documentation

## 2024-01-12 DIAGNOSIS — S52125A Nondisplaced fracture of head of left radius, initial encounter for closed fracture: Secondary | ICD-10-CM | POA: Insufficient documentation

## 2024-01-12 DIAGNOSIS — S4992XA Unspecified injury of left shoulder and upper arm, initial encounter: Secondary | ICD-10-CM | POA: Diagnosis present

## 2024-01-13 ENCOUNTER — Encounter (HOSPITAL_COMMUNITY): Payer: Self-pay

## 2024-01-13 ENCOUNTER — Emergency Department (HOSPITAL_COMMUNITY)

## 2024-01-13 ENCOUNTER — Other Ambulatory Visit: Payer: Self-pay

## 2024-01-13 ENCOUNTER — Emergency Department (HOSPITAL_COMMUNITY)
Admission: EM | Admit: 2024-01-13 | Discharge: 2024-01-13 | Disposition: A | Discharge status: Home / Self Care | Attending: Emergency Medicine | Admitting: Emergency Medicine

## 2024-01-13 DIAGNOSIS — S52125A Nondisplaced fracture of head of left radius, initial encounter for closed fracture: Secondary | ICD-10-CM

## 2024-01-13 MED ORDER — IBUPROFEN 400 MG PO TABS
400.0000 mg | ORAL_TABLET | Freq: Once | ORAL | Status: AC
  Administered 2024-01-13: 400 mg via ORAL
  Filled 2024-01-13: qty 1

## 2024-01-13 MED ORDER — HYDROCODONE-ACETAMINOPHEN 5-325 MG PO TABS
1.0000 | ORAL_TABLET | Freq: Once | ORAL | Status: AC
  Administered 2024-01-13: 1 via ORAL
  Filled 2024-01-13: qty 1

## 2024-01-13 NOTE — ED Triage Notes (Signed)
 Pt injured left arm this afternoon after falling while roller skating.

## 2024-01-13 NOTE — ED Provider Notes (Signed)
 [place] EMERGENCY DEPARTMENT AT [facility] Provider Note   CSN: [id number] Arrival date & time: 01/12/24  2342     History  Chief Complaint  Patient presents with   Arm Injury    Shelby Acevedo is a 23 y.o. female.  The history is provided by the patient.  Patient presents for left arm pain.  Patient was at the rollerskating rink yesterday afternoon when she slipped and fell.  She reports falling and injuring her left arm.  No head injury or LOC.  She reports she has pain in the left elbow and the left wrist.  No weakness or numbness is reported. No acute traumatic injuries are reported    Past Medical History:  Diagnosis Date   Pregnancy induced hypertension    Scoliosis     Home Medications Prior to Admission medications   Medication Sig Start Date End Date Taking? Authorizing Provider  acetaminophen (TYLENOL) 325 MG tablet Take 2 tablets (650 mg total) by mouth every 4 (four) hours as needed (for pain scale < 4). Patient not taking: Reported on 12/21/2023 10/16/23   Bernerd Limbo, CNM  ferrous sulfate 325 (65 FE) MG EC tablet Take 325 mg by mouth 3 (three) times daily with meals.    [provider]  ibuprofen (ADVIL) 600 MG tablet Take 1 tablet (600 mg total) by mouth every 6 (six) hours. Patient not taking: Reported on 12/21/2023 10/16/23   Bernerd Limbo, CNM  medroxyPROGESTERone (DEPO-PROVERA) 150 MG/ML injection Inject 1 mL (150 mg total) into the muscle every 3 (three) months. 12/21/23   Cresenzo-Dishmon, Scarlette Calico, CNM  prenatal vitamin w/FE, FA (PRENATAL 1 + 1) 27-1 MG TABS tablet Take 1 tablet by mouth daily at 12 noon. Patient not taking: Reported on 12/21/2023 02/05/23   Adline Potter, NP      Allergies    Patient has no known allergies.    Review of Systems   Review of Systems  Musculoskeletal:  Positive for arthralgias.  Neurological:  Negative for headaches.    Physical Exam Updated Vital Signs BP (!) 142/84 (BP  Location: Right Arm)   Pulse 97   Temp 99.1 F (37.3 C) (Oral)   Resp 18   Ht 1.626 m (5\' 4" )   Wt 80 kg   LMP 12/16/2023 (Approximate)   SpO2 100%   BMI 30.27 kg/m  Physical Exam CONSTITUTIONAL: Well developed/well nourished, walking around the room HEAD: Normocephalic/atraumatic, no visible trauma NEURO: Pt is awake/alert/appropriate, moves all extremitiesx4.  No facial droop.   EXTREMITIES: pulses normal/equal, full ROM Distal pulses intact in left upper extremity.  Tenderness and swelling is noted to left elbow.  Tenderness noted to the left wrist in the snuffbox.  She is able to range the left shoulder without difficulty. No other signs of acute traumatic injury extremities No evidence of any injury to her right upper extremity SKIN: warm, color normal  ED Results / Procedures / Treatments   Labs (all labs ordered are listed, but only abnormal results are displayed) Labs Reviewed - No data to display  EKG None  Radiology DG Wrist Complete Left Result Date: 01/13/2024 CLINICAL DATA:  Fall EXAM: LEFT WRIST - COMPLETE 3+ VIEW COMPARISON:  Left hand x-ray 11/04/2019 FINDINGS: There is no evidence of fracture or dislocation. There is no evidence of arthropathy or other focal bone abnormality. There is soft tissue swelling surrounding the distal forearm. IMPRESSION: 1. No acute fracture or dislocation. 2. Soft tissue swelling surrounding the distal  forearm. Electronically Signed   By: Darliss Cheney M.D.   On: 01/13/2024 02:25   DG Elbow Complete Left Result Date: 01/13/2024 CLINICAL DATA:  Fall EXAM: LEFT ELBOW - COMPLETE 3+ VIEW COMPARISON:  None Available. FINDINGS: Joint effusion is present. There is a nondisplaced radial head fracture. There is no dislocation. IMPRESSION: Nondisplaced radial head fracture. Electronically Signed   By: Darliss Cheney M.D.   On: 01/13/2024 02:21    Procedures .Ortho Injury Treatment  Date/Time: 01/13/2024 4:09 AM  Performed by: Zadie Rhine, MD Authorized by: Zadie Rhine, MD   Consent:    Consent obtained:  Verbal   Consent given by:  Patient   Alternatives discussed:  No treatmentInjury location: elbow Location details: left elbow Injury type: fracture Fracture type: radial head Pre-procedure neurovascular assessment: neurovascularly intact Pre-procedure distal perfusion: normal Pre-procedure neurological function: normal Pre-procedure range of motion: reduced Manipulation performed: no Immobilization: splint Splint type: long arm Splint Applied by: ED Nurse Post-procedure neurovascular assessment: post-procedure neurovascularly intact Post-procedure distal perfusion: normal Post-procedure neurological function: normal Post-procedure range of motion: unchanged       Medications Ordered in ED Medications  ibuprofen (ADVIL) tablet 400 mg (400 mg Oral Given 01/13/24 0349)  HYDROcodone-acetaminophen (NORCO/VICODIN) 5-325 MG per tablet 1 tablet (1 tablet Oral Given 01/13/24 0349)    ED Course/ Medical Decision Making/ A&P                                 Medical Decision Making Amount and/or Complexity of Data Reviewed Radiology: ordered.  Risk Prescription drug management.   Patient presents after falling at the rollerskating rink and injuring her left arm She has pain in the left elbow and left wrist. She does have a radial head fracture.  Also has pain along the left snuffbox.  Will place in a long-arm splint and also apply a Velcro wrist splint as scaphoid injury is not ruled out She would need follow-up with orthopedics in about 1 week for recheck, at that point may need to take the splint off and only use a sling No other signs of acute traumatic injury       Final Clinical Impression(s) / ED Diagnoses Final diagnoses:  Closed nondisplaced fracture of head of left radius, initial encounter    Rx / DC Orders ED Discharge Orders     None         Zadie Rhine, MD 01/13/24  [phone]

## 2024-01-13 NOTE — ED Notes (Signed)
 Patient transported to CT

## 2024-01-19 ENCOUNTER — Ambulatory Visit: Admitting: Orthopedic Surgery

## 2024-01-19 ENCOUNTER — Encounter: Payer: Self-pay | Admitting: Orthopedic Surgery

## 2024-01-19 DIAGNOSIS — S63502A Unspecified sprain of left wrist, initial encounter: Secondary | ICD-10-CM | POA: Diagnosis not present

## 2024-01-19 DIAGNOSIS — S52125A Nondisplaced fracture of head of left radius, initial encounter for closed fracture: Secondary | ICD-10-CM | POA: Diagnosis not present

## 2024-01-19 NOTE — Progress Notes (Signed)
 New Patient Visit  Assessment: Shelby Acevedo is a 23 y.o. female with the following: 1. Wrist sprain, left, initial encounter 2. Closed nondisplaced fracture of head of left radius, initial encounter   Plan: Shelby Acevedo fell at the roller rink last week.  She has a left wrist sprain, as well as a minimally displaced fracture of the left radial head.  Sling for comfort.  Brace for comfort of the left wrist.  Medications as needed.  Start working on range of motion of left elbow.  Follow-up in 2 weeks with repeat x-rays.  Follow-up: Return in about 2 weeks (around 02/02/2024).  Subjective:  Chief Complaint  Patient presents with   Fracture    L elbow DOI 01/13/24    History of Present Illness: Shelby Acevedo is a 23 y.o. female who presents for evaluation of left arm pain.  She is right-hand dominant.  She went to a roller rink last week, and lost her balance.  She fell on an outstretched left arm.  She had pain in the wrist, as well as the elbow.  She presented to the emergency department.  Radiographs were obtained.  She has been in a posterior slab splint we will immobilize in the left elbow, as well as using a sling.  She is also been using a brace on the left wrist.  No numbness or tingling.   Review of Systems: No fevers or chills No numbness or tingling No chest pain No shortness of breath No bowel or bladder dysfunction No GI distress No headaches   Medical History:  Past Medical History:  Diagnosis Date   Pregnancy induced hypertension    Scoliosis     Past Surgical History:  Procedure Laterality Date   TYMPANOSTOMY TUBE PLACEMENT     WISDOM TOOTH EXTRACTION      Family History  Problem Relation Age of Onset   Stroke Mother    Hypertension Mother    Congestive Heart Failure Mother    Thyroid disease Mother    Diabetes Paternal Uncle    Diabetes Maternal Grandmother    Alzheimer's disease Maternal Grandfather    Social History   Tobacco Use    Smoking status: Never   Smokeless tobacco: Never  Vaping Use   Vaping status: Former  Substance Use Topics   Alcohol use: No   Drug use: No    No Known Allergies  Current Meds  Medication Sig   ferrous sulfate 325 (65 FE) MG EC tablet Take 325 mg by mouth 3 (three) times daily with meals.   ibuprofen (ADVIL) 600 MG tablet Take 1 tablet (600 mg total) by mouth every 6 (six) hours.   medroxyPROGESTERone (DEPO-PROVERA) 150 MG/ML injection Inject 1 mL (150 mg total) into the muscle every 3 (three) months.    Objective: LMP 12/16/2023 (Approximate)   Physical Exam:  General: Alert and oriented. and No acute distress. Gait: Normal gait.  Evaluation of left arm demonstrates some swelling and bruising to the left elbow.  She has restricted range of motion of the elbow, due to swelling and pain.  Fingers are warm and well-perfused.  Minimal swelling to the left wrist.  She is able to make a fist.  Sensation intact throughout the left hand.  IMAGING: I personally reviewed images previously obtained from the ED   X-rays of the left wrist are negative for acute injury  X-ray of the left elbow demonstrates a minimally displaced fracture of the left radial head.   New Medications:  No orders of the defined types were placed in this encounter.     Oliver Barre, MD  01/19/2024 11:09 AM

## 2024-02-02 ENCOUNTER — Ambulatory Visit: Admitting: Orthopedic Surgery

## 2024-03-14 ENCOUNTER — Ambulatory Visit: Payer: Medicaid Other

## 2024-03-14 ENCOUNTER — Encounter: Payer: Self-pay | Admitting: Advanced Practice Midwife

## 2024-03-15 ENCOUNTER — Ambulatory Visit

## 2024-03-18 ENCOUNTER — Ambulatory Visit

## 2024-03-23 ENCOUNTER — Ambulatory Visit: Admitting: *Deleted

## 2024-03-23 DIAGNOSIS — Z3042 Encounter for surveillance of injectable contraceptive: Secondary | ICD-10-CM

## 2024-03-23 MED ORDER — MEDROXYPROGESTERONE ACETATE 150 MG/ML IM SUSP
150.0000 mg | Freq: Once | INTRAMUSCULAR | Status: AC
Start: 2024-03-23 — End: 2024-03-23
  Administered 2024-03-23: 150 mg via INTRAMUSCULAR

## 2024-03-23 NOTE — Progress Notes (Signed)
   NURSE VISIT- INJECTION  SUBJECTIVE:  Shelby Acevedo is a 23 y.o. [phone] female here for a Depo Provera  for contraception/period management. She is a GYN patient.   OBJECTIVE:  There were no vitals taken for this visit.  Appears well, in no apparent distress  Injection administered in: Left deltoid  Meds ordered this encounter  Medications   medroxyPROGESTERone  (DEPO-PROVERA ) injection 150 mg    ASSESSMENT: GYN patient Depo Provera  for contraception/period management PLAN: Follow-up: in 11-13 weeks for next Depo   Laverne Potter  03/23/2024 11:29 AM

## 2024-04-08 ENCOUNTER — Encounter (HOSPITAL_COMMUNITY)

## 2024-04-08 ENCOUNTER — Ambulatory Visit

## 2024-05-12 ENCOUNTER — Ambulatory Visit

## 2024-05-12 ENCOUNTER — Encounter (HOSPITAL_COMMUNITY)

## 2024-06-06 ENCOUNTER — Other Ambulatory Visit: Payer: Self-pay

## 2024-06-06 DIAGNOSIS — I872 Venous insufficiency (chronic) (peripheral): Secondary | ICD-10-CM

## 2024-06-15 ENCOUNTER — Ambulatory Visit

## 2024-07-04 ENCOUNTER — Encounter: Payer: Self-pay | Admitting: Advanced Practice Midwife

## 2024-07-06 ENCOUNTER — Ambulatory Visit (HOSPITAL_COMMUNITY)
Admission: RE | Admit: 2024-07-06 | Discharge: 2024-07-06 | Disposition: A | Discharge status: Home / Self Care | Source: Ambulatory Visit | Attending: Vascular Surgery | Admitting: Vascular Surgery

## 2024-07-06 ENCOUNTER — Ambulatory Visit

## 2024-07-06 VITALS — BP 103/67 | HR 72 | Temp 98.3°F | Wt 189.3 lb

## 2024-07-06 DIAGNOSIS — I83811 Varicose veins of right lower extremities with pain: Secondary | ICD-10-CM | POA: Insufficient documentation

## 2024-07-06 DIAGNOSIS — I872 Venous insufficiency (chronic) (peripheral): Secondary | ICD-10-CM | POA: Insufficient documentation

## 2024-07-07 NOTE — Progress Notes (Signed)
 Requested by:  Practice, [facility] [address] [place],  [place] [address]  Reason for consultation: leg swelling    History of Present Illness   Shelby Acevedo is a 23 y.o. (03-May-2001) female who presents for evaluation of leg swelling.  She has previously been seen at our office in 2023 for painful right lower extremity varicose veins.  She had evidence of venous insufficiency in the right lower extremity, however she was not a candidate for saphenous vein ablation because it was too small.  At that time she was also considering having more children.  She was recommended conservative therapy including compression stockings and leg elevation.  She returns today for repeat evaluation.  She says over the past 2 years her leg swelling has gotten significantly worse.  By the end of the day her lower legs and ankles will double in size and feel very achy, heavy, and tired.  Her leg swelling is worse on the right.  She says that her varicose veins on the right will also get very sore later on the day.  She does not wear any compression stockings.  She says that she tries to elevate her legs above her heart when she can but is busy with 3 children.  She has no prior history of DVT or previous vein procedures.  She is not planning on having any more children.  Past Medical History:  Diagnosis Date   Pregnancy induced hypertension    Scoliosis     Past Surgical History:  Procedure Laterality Date   TYMPANOSTOMY TUBE PLACEMENT     WISDOM TOOTH EXTRACTION      Social History   Socioeconomic History   Marital status: Significant Other    Spouse name: Not on file   Number of children: Not on file   Years of education: Not on file   Highest education level: Not on file  Occupational History   Not on file  Tobacco Use   Smoking status: Never   Smokeless tobacco: Never  Vaping Use   Vaping status: Former  Substance and Sexual Activity   Alcohol use: No   Drug use: No   Sexual  activity: Yes    Birth control/protection: None, Injection  Other Topics Concern   Not on file  Social History Narrative   ** Merged History Encounter **       Social Drivers of Health   Financial Resource Strain: Low Risk  (02/05/2023)   Overall Financial Resource Strain (CARDIA)    Difficulty of Paying Living Expenses: Not very hard  Food Insecurity: No Food Insecurity (02/05/2023)   Hunger Vital Sign    Worried About Running Out of Food in the Last Year: Never true    Ran Out of Food in the Last Year: Never true  Transportation Needs: No Transportation Needs (02/05/2023)   PRAPARE - Administrator, Civil Service (Medical): No    Lack of Transportation (Non-Medical): No  Physical Activity: Inactive (02/05/2023)   Exercise Vital Sign    Days of Exercise per Week: 0 days    Minutes of Exercise per Session: 0 min  Stress: Stress Concern Present (02/05/2023)   [employer] of Occupational Health - Occupational Stress Questionnaire    Feeling of Stress : To some extent  Social Connections: Moderately Isolated (02/05/2023)   Social Connection and Isolation Panel    Frequency of Communication with Friends and Family: Three times a week    Frequency of Social Gatherings  with Friends and Family: Never    Attends Religious Services: Never    Database administrator or Organizations: No    Attends Banker Meetings: Never    Marital Status: Living with partner  Intimate Partner Violence: Not At Risk (02/05/2023)   Humiliation, Afraid, Rape, and Kick questionnaire    Fear of Current or Ex-Partner: No    Emotionally Abused: No    Physically Abused: No    Sexually Abused: No    Family History  Problem Relation Age of Onset   Stroke Mother    Hypertension Mother    Congestive Heart Failure Mother    Thyroid disease Mother    Diabetes Paternal Uncle    Diabetes Maternal Grandmother    Alzheimer's disease Maternal Grandfather     Current Outpatient Medications   Medication Sig Dispense Refill   ferrous sulfate 325 (65 FE) MG EC tablet Take 325 mg by mouth 3 (three) times daily with meals.     ibuprofen  (ADVIL ) 600 MG tablet Take 1 tablet (600 mg total) by mouth every 6 (six) hours. 30 tablet 0   medroxyPROGESTERone  (DEPO-PROVERA ) 150 MG/ML injection Inject 1 mL (150 mg total) into the muscle every 3 (three) months. 1 mL 3   No current facility-administered medications for this visit.    No Known Allergies  REVIEW OF SYSTEMS (negative unless checked):   Cardiac:  []  Chest pain or chest pressure? []  Shortness of breath upon activity? []  Shortness of breath when lying flat? []  Irregular heart rhythm?  Vascular:  []  Pain in calf, thigh, or hip brought on by walking? []  Pain in feet at night that wakes you up from your sleep? []  Blood clot in your veins? [x]  Leg swelling?  Pulmonary:  []  Oxygen at home? []  Productive cough? []  Wheezing?  Neurologic:  []  Sudden weakness in arms or legs? []  Sudden numbness in arms or legs? []  Sudden onset of difficult speaking or slurred speech? []  Temporary loss of vision in one eye? []  Problems with dizziness?  Gastrointestinal:  []  Blood in stool? []  Vomited blood?  Genitourinary:  []  Burning when urinating? []  Blood in urine?  Psychiatric:  []  Major depression  Hematologic:  []  Bleeding problems? []  Problems with blood clotting?  Dermatologic:  []  Rashes or ulcers?  Constitutional:  []  Fever or chills?  Ear/Nose/Throat:  []  Change in hearing? []  Nose bleeds? []  Sore throat?  Musculoskeletal:  []  Back pain? []  Joint pain? []  Muscle pain?   Physical Examination     Vitals:   07/06/24 1259  BP: 103/67  Pulse: 72  Temp: 98.3 F (36.8 C)  TempSrc: Temporal  Weight: 189 lb 4.8 oz (85.9 kg)   Body mass index is 32.49 kg/m.  General:  WDWN in NAD; vital signs documented above Gait: Not observed HENT: WNL, normocephalic Pulmonary: normal non-labored breathing ,  without Rales, rhonchi,  wheezing Cardiac: regular Abdomen: soft, NT, no masses Skin: without rashes Vascular Exam/Pulses: 2+ DP/PT pulses bilaterally Extremities: LLE without varicose veins, with reticular veins, with edema, without stasis pigmentation, without lipodermatosclerosis, without ulcers Musculoskeletal: no muscle wasting or atrophy  Neurologic: A&O X 3;  No focal weakness or paresthesias are detected Psychiatric:  The pt has Normal affect.  Non-invasive Vascular Imaging   LLE Venous Insufficiency Duplex (07/06/2024):  +--------------+---------+------+-----------+------------+--------+  LEFT         Reflux NoRefluxReflux TimeDiameter cmsComments  Yes                                   +--------------+---------+------+-----------+------------+--------+  CFV          no                                              +--------------+---------+------+-----------+------------+--------+  FV mid        no                                              +--------------+---------+------+-----------+------------+--------+  Popliteal    no                                              +--------------+---------+------+-----------+------------+--------+  GSV at SFJ              yes    >500 ms      0.85              +--------------+---------+------+-----------+------------+--------+  GSV prox thigh          yes    >500 ms      0.60              +--------------+---------+------+-----------+------------+--------+  GSV mid thigh no                            0.55              +--------------+---------+------+-----------+------------+--------+  GSV dist thigh          yes    >500 ms      0.52              +--------------+---------+------+-----------+------------+--------+  GSV at knee             yes    >500 ms      0.53              +--------------+---------+------+-----------+------------+--------+  GSV  prox calf no                            0.37              +--------------+---------+------+-----------+------------+--------+  GSV mid calf            yes    >500 ms      0.38              +--------------+---------+------+-----------+------------+--------+  GSV dist calf           yes    >500 ms      0.32              +--------------+---------+------+-----------+------------+--------+  SSV at [facility]    no                            0.33              +--------------+---------+------+-----------+------------+--------+     Medical Decision Making  Tiffine Hillary is a 23 y.o. female who presents for evaluation of leg swelling  Based on the patient's duplex, there is reflux in the left greater saphenous vein at the saphenofemoral junction, proximal thigh, distal thigh, knee, and mid to distal calf.  The remainder of the patient's deep and superficial venous system is competent.  There is no evidence of DVT or SVT on exam.  She would be a potential candidate for left greater saphenous vein ablation.  The patient reports several years of bilateral lower extremity edema, which has worsened over time.  Her leg swelling is worse on the right.  She spends several hours a day on her feet with her children, and by the mid afternoon her lower legs and ankles are significantly swollen.  This causes her a lot of aching, heaviness, and general discomfort.  She currently does not wear any compression stockings.  She does elevate her legs above her heart when she can.  This does help mildly with her symptoms. On exam she has moderate, nonpitting edema of bilateral lower legs.  She has palpable pedal pulses bilaterally.  She has several varicose veins on the right. The patient is very interested in getting a procedure done to help with her leg swelling and discomfort.  She is potentially a candidate for left greater saphenous vein ablation.  I have encouraged conservative therapy for the  next 3 months including regular leg elevation above the heart, avoiding prolonged sitting and standing, exercise, and use of thigh-high compression stockings daily. She was measured for and given a pair of 20 to 30 mmHg thigh-high compression stockings.  She will wear these daily.  She can follow-up with Dr. Sheree or Dr.Brabham in a few months for reevaluation   Ahmed SHAUNNA Holster, PA-C [facility] Office: [phone]  07/07/2024, 2:06 PM  Clinic MD: Sheree

## 2024-07-08 ENCOUNTER — Ambulatory Visit

## 2024-07-11 ENCOUNTER — Telehealth: Admitting: Physician Assistant

## 2024-07-11 DIAGNOSIS — R3989 Other symptoms and signs involving the genitourinary system: Secondary | ICD-10-CM | POA: Diagnosis not present

## 2024-07-11 MED ORDER — NITROFURANTOIN MONOHYD MACRO 100 MG PO CAPS: 100.0000 mg | ORAL_CAPSULE | Freq: Two times a day (BID) | ORAL | 0 refills | Status: DC

## 2024-07-11 NOTE — Progress Notes (Signed)

## 2024-08-15 ENCOUNTER — Ambulatory Visit (INDEPENDENT_AMBULATORY_CARE_PROVIDER_SITE_OTHER)

## 2024-08-15 VITALS — BP 110/68 | Ht 64.0 in | Wt 191.0 lb

## 2024-08-15 DIAGNOSIS — Z3201 Encounter for pregnancy test, result positive: Secondary | ICD-10-CM

## 2024-08-15 DIAGNOSIS — Z789 Other specified health status: Secondary | ICD-10-CM | POA: Diagnosis not present

## 2024-08-15 LAB — POCT URINE PREGNANCY: Preg Test, Ur: POSITIVE — AB

## 2024-08-15 MED ORDER — PRENATAL PLUS 27-1 MG PO TABS: ORAL_TABLET | ORAL | 3 refills | Status: AC

## 2024-08-15 NOTE — Progress Notes (Signed)
   NURSE VISIT- PREGNANCY CONFIRMATION   SUBJECTIVE:  Shelby Acevedo is a 23 y.o. [phone] female at Unknown by uncertain LMP of No LMP recorded (lmp unknown). Patient is pregnant. Here for pregnancy confirmation.  Home pregnancy test: positive x 2  She reports no complaints.  She is not taking prenatal vitamins.    OBJECTIVE:  BP 110/68 (BP Location: Left Arm, Patient Position: Sitting, Cuff Size: Normal)   Ht 5' 4 (1.626 m)   Wt 191 lb (86.6 kg)   LMP  (LMP Unknown)   Breastfeeding No   BMI 32.79 kg/m   Appears well, in no apparent distress  Results for orders placed or performed in visit on 08/15/24 (from the past 24 hours)  POCT urine pregnancy   Collection Time: 08/15/24  9:31 AM  Result Value Ref Range   Preg Test, Ur Positive (A) Negative    ASSESSMENT: Positive pregnancy test, Unknown by LMP    PLAN: Schedule for dating ultrasound when Hcg comes back Prenatal vitamins: note routed to Luke Fetters CNM to send prescription   Nausea medicines: not currently needed   OB packet given: Yes  Shelby Acevedo  08/15/2024 9:36 AM

## 2024-08-15 NOTE — Addendum Note (Signed)
 Addended by: KIZZIE SUZEN SAUNDERS on: 08/15/2024 09:43 AM   Modules accepted: Orders

## 2024-08-16 ENCOUNTER — Telehealth: Payer: Self-pay

## 2024-08-16 ENCOUNTER — Encounter: Payer: Self-pay | Admitting: Women's Health

## 2024-08-16 ENCOUNTER — Ambulatory Visit: Payer: Self-pay | Admitting: Women's Health

## 2024-08-16 DIAGNOSIS — Z3491 Encounter for supervision of normal pregnancy, unspecified, first trimester: Secondary | ICD-10-CM

## 2024-08-16 LAB — BETA HCG QUANT (REF LAB): hCG Quant: 3702 m[IU]/mL

## 2024-08-16 NOTE — Telephone Encounter (Signed)
 Left message for patient to call office to get dating ultrasound scheduled.

## 2024-08-29 ENCOUNTER — Ambulatory Visit (INDEPENDENT_AMBULATORY_CARE_PROVIDER_SITE_OTHER)

## 2024-08-29 DIAGNOSIS — Z3491 Encounter for supervision of normal pregnancy, unspecified, first trimester: Secondary | ICD-10-CM

## 2024-08-29 DIAGNOSIS — Z3A01 Less than 8 weeks gestation of pregnancy: Secondary | ICD-10-CM

## 2024-08-29 DIAGNOSIS — O3680X0 Pregnancy with inconclusive fetal viability, not applicable or unspecified: Secondary | ICD-10-CM | POA: Diagnosis not present

## 2024-08-29 NOTE — Progress Notes (Signed)
 US  6+5 wks,single IUP with yolk sac,CRL 8.24 mm,FHR 133 bpm,normal ovaries

## 2024-09-23 ENCOUNTER — Other Ambulatory Visit: Payer: Self-pay | Admitting: Adult Health

## 2024-09-23 ENCOUNTER — Encounter: Payer: Self-pay | Admitting: Women's Health

## 2024-09-23 MED ORDER — ONDANSETRON HCL 4 MG PO TABS: 4.0000 mg | ORAL_TABLET | Freq: Three times a day (TID) | ORAL | 1 refills | Status: AC | PRN

## 2024-09-23 NOTE — Progress Notes (Signed)
 Rx zofran

## 2024-10-05 ENCOUNTER — Encounter: Admitting: Advanced Practice Midwife

## 2024-10-05 ENCOUNTER — Encounter: Admitting: *Deleted

## 2024-10-12 ENCOUNTER — Encounter: Payer: Self-pay | Admitting: *Deleted

## 2024-10-12 DIAGNOSIS — Z3480 Encounter for supervision of other normal pregnancy, unspecified trimester: Secondary | ICD-10-CM | POA: Insufficient documentation

## 2024-10-13 ENCOUNTER — Encounter: Payer: Self-pay | Admitting: Women's Health

## 2024-10-13 ENCOUNTER — Encounter: Admitting: *Deleted

## 2024-10-13 ENCOUNTER — Other Ambulatory Visit (HOSPITAL_COMMUNITY)
Admission: RE | Admit: 2024-10-13 | Discharge: 2024-10-13 | Disposition: A | Source: Ambulatory Visit | Attending: Women's Health | Admitting: Women's Health

## 2024-10-13 ENCOUNTER — Ambulatory Visit: Admitting: Women's Health

## 2024-10-13 VITALS — BP 106/75 | HR 78 | Wt 185.0 lb

## 2024-10-13 DIAGNOSIS — Z3481 Encounter for supervision of other normal pregnancy, first trimester: Secondary | ICD-10-CM

## 2024-10-13 DIAGNOSIS — Z3A13 13 weeks gestation of pregnancy: Secondary | ICD-10-CM

## 2024-10-13 DIAGNOSIS — Z113 Encounter for screening for infections with a predominantly sexual mode of transmission: Secondary | ICD-10-CM

## 2024-10-13 DIAGNOSIS — Z363 Encounter for antenatal screening for malformations: Secondary | ICD-10-CM

## 2024-10-13 DIAGNOSIS — Z3483 Encounter for supervision of other normal pregnancy, third trimester: Secondary | ICD-10-CM

## 2024-10-13 DIAGNOSIS — O09299 Supervision of pregnancy with other poor reproductive or obstetric history, unspecified trimester: Secondary | ICD-10-CM

## 2024-10-13 DIAGNOSIS — Z131 Encounter for screening for diabetes mellitus: Secondary | ICD-10-CM

## 2024-10-13 MED ORDER — BLOOD PRESSURE CUFF MISC: 1.0000 | 0 refills | Status: AC

## 2024-10-13 MED ORDER — ASPIRIN 81 MG PO TBEC: 81.0000 mg | DELAYED_RELEASE_TABLET | Freq: Every day | ORAL | 6 refills | Status: AC

## 2024-10-13 MED ORDER — DOXYLAMINE-PYRIDOXINE 10-10 MG PO TBEC: DELAYED_RELEASE_TABLET | ORAL | 6 refills | Status: AC

## 2024-10-13 NOTE — Progress Notes (Signed)
 INITIAL OBSTETRICAL VISIT Patient name: Shelby Acevedo MRN [id number]  Date of birth: [birth year] Chief Complaint:   Initial Prenatal Visit  History of Present Illness:   Shelby Acevedo is a 23 y.o. [id number]  female at [redacted]w[redacted]d by US  at 6 weeks with an Estimated Date of Delivery: 04/19/25 being seen today for her initial obstetrical visit.   Her obstetrical history is significant for pre-eclampsia.   Today she reports nausea.     10/13/2024    3:03 PM 07/09/2023    9:31 AM 04/02/2023   11:08 AM 02/05/2023   10:40 AM 09/06/2020    2:47 PM  Depression screen PHQ 2/9  Decreased Interest 0 0 0 0 0  Down, Depressed, Hopeless 0 0 0 0 1  PHQ - 2 Score 0 0 0 0 1  Altered sleeping 1 0 0 1 0  Tired, decreased energy 1 0 2 1 0  Change in appetite 0  0 1 0  Feeling bad or failure about yourself  0 0 0 0 0  Trouble concentrating 0 0 0 0 0  Moving slowly or fidgety/restless 0 0 0 0 0  Suicidal thoughts 0 0 0 0 0  PHQ-9 Score 2 0  2  3  1    Difficult doing work/chores     Not difficult at all     Data saved with a previous flowsheet row definition    No LMP recorded. Patient is pregnant. Last pap  Diagnosis  Date Value Ref Range Status  12/21/2023   Final   - Negative for intraepithelial lesion or malignancy (NILM)   Review of Systems:   Pertinent items are noted in HPI Denies cramping/contractions, leakage of fluid, vaginal bleeding, abnormal vaginal discharge w/ itching/odor/irritation, headaches, visual changes, shortness of breath, chest pain, abdominal pain, severe nausea/vomiting, or problems with urination or bowel movements unless otherwise stated above.  Pertinent History Reviewed:  Reviewed past medical,surgical, social, obstetrical and family history.  Reviewed problem list, medications and allergies. OB History  Gravida Para Term Preterm AB Living  6 3 3  0 2 3  SAB IAB Ectopic Multiple Live Births  2 0 0 0 3    # Outcome Date GA Lbr Len/2nd Weight Sex Type Anes PTL Lv  6  Current           5 Term 10/15/23 [redacted]w[redacted]d 02:54 / 00:03 7 lb 6.3 oz (3.355 kg) M Vag-Spont None  LIV  4 Term 11/16/21 [redacted]w[redacted]d  6 lb 3 oz (2.807 kg) M Vag-Spont None N LIV  3 SAB 09/18/20          2 SAB 2021          1 Term 08/31/18 [redacted]w[redacted]d  6 lb 2 oz (2.778 kg) M Vag-Spont None N LIV     Complications: Mild pre-eclampsia   Physical Assessment:   Vitals:   10/13/24 1511  BP: 106/75  Pulse: 78  Weight: 185 lb (83.9 kg)  Body mass index is 31.76 kg/m.       Physical Examination:  General appearance - well appearing, and in no distress  Mental status - alert, oriented to person, place, and time  Psych:  She has a normal mood and affect  Skin - warm and dry, normal color, no suspicious lesions noted  Chest - effort normal  Heart - normal rate and regular rhythm  Abdomen - soft, nontender  Extremities:  No swelling or varicosities noted   BS US :  FHR ~ 160  No  results found for this or any previous visit (from the past 24 hours).   Indications for ASA therapy (per uptodate) One of the following: Previous pregnancy with preeclampsia, especially early onset and with an adverse outcome Yes     10/13/2024    3:03 PM 07/09/2023    9:31 AM 04/02/2023   11:08 AM 02/05/2023   10:40 AM 09/06/2020    2:47 PM  Depression screen PHQ 2/9  Decreased Interest 0 0 0 0 0  Down, Depressed, Hopeless 0 0 0 0 1  PHQ - 2 Score 0 0 0 0 1  Altered sleeping 1 0 0 1 0  Tired, decreased energy 1 0 2 1 0  Change in appetite 0  0 1 0  Feeling bad or failure about yourself  0 0 0 0 0  Trouble concentrating 0 0 0 0 0  Moving slowly or fidgety/restless 0 0 0 0 0  Suicidal thoughts 0 0 0 0 0  PHQ-9 Score 2 0  2  3  1    Difficult doing work/chores     Not difficult at all     Data saved with a previous flowsheet row definition        10/13/2024    3:04 PM 07/09/2023    9:31 AM 04/02/2023   11:08 AM 02/05/2023   10:40 AM  GAD 7 : Generalized Anxiety Score  Nervous, Anxious, on Edge 1 0 1 1  Control/stop  worrying 0 0 0 0  Worry too much - different things 1 0 0 0  Trouble relaxing 0 0 0 1  Restless 0 0 0 0  Easily annoyed or irritable 0 0 0 1  Afraid - awful might happen 0 0 0 0  Total GAD 7 Score 2 0 1 3      Mental Health score normal Yes Follow up: N/A   Assessment & Plan:  1) Low-Risk Pregnancy [id number] at [redacted]w[redacted]d with an Estimated Date of Delivery: 04/19/25   2) Initial OB visit    1. History of pre-eclampsia in prior pregnancy, currently pregnant (Primary) ASA 81mg  - Comp Met (CMET) - Protein / creatinine ratio, urine - Blood Pressure Monitoring (BLOOD PRESSURE CUFF) MISC; 1 kit by Does not apply route as directed.  Dispense: 1 each; Refill: 0  2. [redacted] weeks gestation of pregnancy  - CBC/D/Plt+RPR+Rh+ABO+RubIgG... - Comp Met (CMET) - Protein / creatinine ratio, urine - HgB A1c - Urine Culture - Cervicovaginal ancillary only( [place]) - [facility] AMB BABYSCRIPTS OPT IN - Blood Pressure Monitoring (BLOOD PRESSURE CUFF) MISC; 1 kit by Does not apply route as directed.  Dispense: 1 each; Refill: 0  3. Encounter for supervision of other normal pregnancy in third trimester  - CBC/D/Plt+RPR+Rh+ABO+RubIgG... - Comp Met (CMET) - Protein / creatinine ratio, urine - HgB A1c - Urine Culture - Cervicovaginal ancillary only( [place]) - [facility] AMB BABYSCRIPTS OPT IN  4. Screening for diabetes mellitus  - HgB A1c  5. Screen for STD (sexually transmitted disease)  - Cervicovaginal ancillary only( [place])  6. Encounter for supervision of other normal pregnancy in first trimester  - PANORAMA PRENATAL TEST       Meds:  Meds ordered this encounter  Medications   Blood Pressure Monitoring (BLOOD PRESSURE CUFF) MISC    Sig: 1 kit by Does not apply route as directed.    Dispense:  1 each    Refill:  0   aspirin  EC 81 MG tablet    Sig:  Take 1 tablet (81 mg total) by mouth daily.    Dispense:  30 tablet    Refill:  6    Supervising Provider:   JAYNE VONN DEL  [[id number]]    Initial labs obtained Continue prenatal vitamins Reviewed n/v relief measures and warning s/s to report Reviewed recommended weight gain based on pre-gravid BMI Encouraged well-balanced diet Genetic & carrier screening discussed: requests Panorama and AFP,   Ultrasound discussed; fetal survey: requested CCNC completed> form faxed if has or is planning to apply for medicaid The nature of [facility] [employer] with multiple MDs and other Advanced Practice Providers was explained to patient; also emphasized that fellows, residents, and students are part of our team. Doesn't have a home bp cuff. Rx faxed  Check bp weekly, let us  know if >140/90.        Cathlean Cresenzo-Dishmon 3:50 PM

## 2024-10-13 NOTE — Patient Instructions (Signed)
 Bradly Just, I greatly value your feedback.  If you receive a survey following your visit with us  today, we appreciate you taking the time to fill it out.  Thanks, Sherrell Ely, DNP, CNM  [facility] HAS MOVED!!! It is now [facility] ([address] [place], [place] [address]) Entrance located off of [address] Free 24/7 valet parking   Nausea & Vomiting Have saltine crackers or pretzels by your bed and eat a few bites before you raise your head out of bed in the morning Eat small frequent meals throughout the day instead of large meals Drink plenty of fluids throughout the day to stay hydrated, just don't drink a lot of fluids with your meals.  This can make your stomach fill up faster making you feel sick Do not brush your teeth right after you eat Products with real ginger are good for nausea, like ginger ale and ginger hard candy Make sure it says made with real ginger! Sucking on sour candy like lemon heads is also good for nausea If your prenatal vitamins make you nauseated, take them at night so you will sleep through the nausea Sea Bands If you feel like you need medicine for the nausea & vomiting please let us  know If you are unable to keep any fluids or food down please let us  know   Constipation Drink plenty of fluid, preferably water, throughout the day Eat foods high in fiber such as fruits, vegetables, and grains Exercise, such as walking, is a good way to keep your bowels regular Drink warm fluids, especially warm prune juice, or decaf coffee Eat a 1/2 cup of real oatmeal (not instant), 1/2 cup applesauce, and 1/2-1 cup warm prune juice every day If needed, you may take Colace (docusate sodium ) stool softener once or twice a day to help keep the stool soft.  If you still are having problems with constipation, you may take Miralax once daily as needed to help keep your bowels regular.   Home Blood Pressure Monitoring for  Patients   Your provider has recommended that you check your blood pressure (BP) at least once a week at home. If you do not have a blood pressure cuff at home, one will be provided for you. Contact your provider if you have not received your monitor within 1 week.   Helpful Tips for Accurate Home Blood Pressure Checks  Don't smoke, exercise, or drink caffeine 30 minutes before checking your BP Use the restroom before checking your BP (a full bladder can raise your pressure) Relax in a comfortable upright chair Feet on the ground Left arm resting comfortably on a flat surface at the level of your heart Legs uncrossed Back supported Sit quietly and don't talk Place the cuff on your bare arm Adjust snuggly, so that only two fingertips can fit between your skin and the top of the cuff Check 2 readings separated by at least one minute Keep a log of your BP readings For a visual, please reference this diagram: http://ccnc.care/bpdiagram  Provider Name: [facility]     Phone: [phone]  Zone 1: ALL CLEAR  Continue to monitor your symptoms:  BP reading is less than 140 (top number) or less than 90 (bottom number)  No right upper stomach pain No headaches or seeing spots No feeling nauseated or throwing up No swelling in face and hands  Zone 2: CAUTION Call your doctor's office for any of the following:  BP  reading is greater than 140 (top number) or greater than 90 (bottom number)  Stomach pain under your ribs in the middle or right side Headaches or seeing spots Feeling nauseated or throwing up Swelling in face and hands  Zone 3: EMERGENCY  Seek immediate medical care if you have any of the following:  BP reading is greater than160 (top number) or greater than 110 (bottom number) Severe headaches not improving with Tylenol  Serious difficulty catching your breath Any worsening symptoms from Zone 2    First Trimester of Pregnancy The first trimester of pregnancy is from  week 1 until the end of week 12 (months 1 through 3). A week after a sperm fertilizes an egg, the egg will implant on the wall of the uterus. This embryo will begin to develop into a baby. Genes from you and your partner are forming the baby. The female genes determine whether the baby is a boy or a girl. At 6-8 weeks, the eyes and face are formed, and the heartbeat can be seen on ultrasound. At the end of 12 weeks, all the baby's organs are formed.  Now that you are pregnant, you will want to do everything you can to have a healthy baby. Two of the most important things are to get good prenatal care and to follow your health care provider's instructions. Prenatal care is all the medical care you receive before the baby's birth. This care will help prevent, find, and treat any problems during the pregnancy and childbirth. BODY CHANGES Your body goes through many changes during pregnancy. The changes vary from woman to woman.  You may gain or lose a couple of pounds at first. You may feel sick to your stomach (nauseous) and throw up (vomit). If the vomiting is uncontrollable, call your health care provider. You may tire easily. You may develop headaches that can be relieved by medicines approved by your health care provider. You may urinate more often. Painful urination may mean you have a bladder infection. You may develop heartburn as a result of your pregnancy. You may develop constipation because certain hormones are causing the muscles that push waste through your intestines to slow down. You may develop hemorrhoids or swollen, bulging veins (varicose veins). Your breasts may begin to grow larger and become tender. Your nipples may stick out more, and the tissue that surrounds them (areola) may become darker. Your gums may bleed and may be sensitive to brushing and flossing. Dark spots or blotches (chloasma, mask of pregnancy) may develop on your face. This will likely fade after the baby is  born. Your menstrual periods will stop. You may have a loss of appetite. You may develop cravings for certain kinds of food. You may have changes in your emotions from day to day, such as being excited to be pregnant or being concerned that something may go wrong with the pregnancy and baby. You may have more vivid and strange dreams. You may have changes in your hair. These can include thickening of your hair, rapid growth, and changes in texture. Some women also have hair loss during or after pregnancy, or hair that feels dry or thin. Your hair will most likely return to normal after your baby is born. WHAT TO EXPECT AT YOUR PRENATAL VISITS During a routine prenatal visit: You will be weighed to make sure you and the baby are growing normally. Your blood pressure will be taken. Your abdomen will be measured to track your baby's growth. The fetal heartbeat  will be listened to starting around week 10 or 12 of your pregnancy. Test results from any previous visits will be discussed. Your health care provider may ask you: How you are feeling. If you are feeling the baby move. If you have had any abnormal symptoms, such as leaking fluid, bleeding, severe headaches, or abdominal cramping. If you have any questions. Other tests that may be performed during your first trimester include: Blood tests to find your blood type and to check for the presence of any previous infections. They will also be used to check for low iron levels (anemia) and Rh antibodies. Later in the pregnancy, blood tests for diabetes will be done along with other tests if problems develop. Urine tests to check for infections, diabetes, or protein in the urine. An ultrasound to confirm the proper growth and development of the baby. An amniocentesis to check for possible genetic problems. Fetal screens for spina bifida and Down syndrome. You may need other tests to make sure you and the baby are doing well. HOME CARE  INSTRUCTIONS  Medicines Follow your health care provider's instructions regarding medicine use. Specific medicines may be either safe or unsafe to take during pregnancy. Take your prenatal vitamins as directed. If you develop constipation, try taking a stool softener if your health care provider approves. Diet Eat regular, well-balanced meals. Choose a variety of foods, such as meat or vegetable-based protein, fish, milk and low-fat dairy products, vegetables, fruits, and whole grain breads and cereals. Your health care provider will help you determine the amount of weight gain that is right for you. Avoid raw meat and uncooked cheese. These carry germs that can cause birth defects in the baby. Eating four or five small meals rather than three large meals a day may help relieve nausea and vomiting. If you start to feel nauseous, eating a few soda crackers can be helpful. Drinking liquids between meals instead of during meals also seems to help nausea and vomiting. If you develop constipation, eat more high-fiber foods, such as fresh vegetables or fruit and whole grains. Drink enough fluids to keep your urine clear or pale yellow. Activity and Exercise Exercise only as directed by your health care provider. Exercising will help you: Control your weight. Stay in shape. Be prepared for labor and delivery. Experiencing pain or cramping in the lower abdomen or low back is a good sign that you should stop exercising. Check with your health care provider before continuing normal exercises. Try to avoid standing for long periods of time. Move your legs often if you must stand in one place for a long time. Avoid heavy lifting. Wear low-heeled shoes, and practice good posture. You may continue to have sex unless your health care provider directs you otherwise. Relief of Pain or Discomfort Wear a good support bra for breast tenderness.   Take warm sitz baths to soothe any pain or discomfort caused by  hemorrhoids. Use hemorrhoid cream if your health care provider approves.   Rest with your legs elevated if you have leg cramps or low back pain. If you develop varicose veins in your legs, wear support hose. Elevate your feet for 15 minutes, 3-4 times a day. Limit salt in your diet. Prenatal Care Schedule your prenatal visits by the twelfth week of pregnancy. They are usually scheduled monthly at first, then more often in the last 2 months before delivery. Write down your questions. Take them to your prenatal visits. Keep all your prenatal visits as directed by  your health care provider. Safety Wear your seat belt at all times when driving. Make a list of emergency phone numbers, including numbers for family, friends, the hospital, and police and fire departments. General Tips Ask your health care provider for a referral to a local prenatal education class. Begin classes no later than at the beginning of month 6 of your pregnancy. Ask for help if you have counseling or nutritional needs during pregnancy. Your health care provider can offer advice or refer you to specialists for help with various needs. Do not use hot tubs, steam rooms, or saunas. Do not douche or use tampons or scented sanitary pads. Do not cross your legs for long periods of time. Avoid cat litter boxes and soil used by cats. These carry germs that can cause birth defects in the baby and possibly loss of the fetus by miscarriage or stillbirth. Avoid all smoking, herbs, alcohol, and medicines not prescribed by your health care provider. Chemicals in these affect the formation and growth of the baby. Schedule a dentist appointment. At home, brush your teeth with a soft toothbrush and be gentle when you floss. SEEK MEDICAL CARE IF:  You have dizziness. You have mild pelvic cramps, pelvic pressure, or nagging pain in the abdominal area. You have persistent nausea, vomiting, or diarrhea. You have a bad smelling vaginal  discharge. You have pain with urination. You notice increased swelling in your face, hands, legs, or ankles. SEEK IMMEDIATE MEDICAL CARE IF:  You have a fever. You are leaking fluid from your vagina. You have spotting or bleeding from your vagina. You have severe abdominal cramping or pain. You have rapid weight gain or loss. You vomit blood or material that looks like coffee grounds. You are exposed to German measles and have never had them. You are exposed to fifth disease or chickenpox. You develop a severe headache. You have shortness of breath. You have any kind of trauma, such as from a fall or a car accident. Document Released: 10/14/2001 Document Revised: 03/06/2014 Document Reviewed: 08/30/2013 [facility] Patient Information 2015 [place], [place]. This information is not intended to replace advice given to you by your health care provider. Make sure you discuss any questions you have with your health care provider.  ADDITIONAL HEALTHCARE OPTIONS FOR PATIENTS  [facility] / e-Visit: [email or web address]         [facility]: [phone]  [facility]: [phone]                   [facility]: [phone]     Safe Medications in Pregnancy   Acne: Benzoyl Peroxide Salicylic Acid  Backache/Headache: Tylenol : 2 regular strength every 4 hours OR              2 Extra strength every 6 hours  Colds/Coughs/Allergies: Benadryl  (alcohol free) 25 mg every 6 hours as needed Breath right strips Claritin Cepacol throat lozenges Chloraseptic throat spray Cold-Eeze- up to three times per day Cough drops, alcohol free Flonase (by prescription only) Guaifenesin Mucinex Robitussin DM (plain only, alcohol free) Saline nasal spray/drops Sudafed (pseudoephedrine) & Actifed ** use only after [redacted] weeks gestation and if you do not have high blood pressure Tylenol  Vicks Vaporub Zinc   lozenges Zyrtec   Constipation: Colace Ducolax suppositories Fleet enema Glycerin  suppositories Metamucil Milk of magnesia Miralax Senokot Smooth move tea  Diarrhea: Kaopectate Imodium A-D  *NO pepto Bismol  Hemorrhoids: Anusol Anusol HC Preparation H Tucks  Indigestion: Tums Maalox Mylanta  Zantac  Pepcid  Insomnia: Benadryl  (alcohol free) 25mg  every 6 hours as needed Tylenol  PM Unisom , no Gelcaps  Leg Cramps: Tums MagGel  Nausea/Vomiting:  Bonine Dramamine Emetrol Ginger extract Sea bands Meclizine  Nausea medication to take during pregnancy:  Unisom  (doxylamine  succinate 25 mg tablets) Take one tablet daily at bedtime. If symptoms are not adequately controlled, the dose can be increased to a maximum recommended dose of two tablets daily (1/2 tablet in the morning, 1/2 tablet mid-afternoon and one at bedtime). Vitamin B6 100mg  tablets. Take one tablet twice a day (up to 200 mg per day).  Skin Rashes: Aveeno products Benadryl  cream or 25mg  every 6 hours as needed Calamine Lotion 1% cortisone cream  Yeast infection: Gyne-lotrimin 7 Monistat 7   **If taking multiple medications, please check labels to avoid duplicating the same active ingredients **take medication as directed on the label ** Do not exceed 4000 mg of tylenol  in 24 hours **Do not take medications that contain aspirin  or ibuprofen    ([phone] is the phone number for Pregnancy Classes or hospital tours at [facility].   You will be referred to  [email or web address]   for more information on childbirth classes   At this site you may register for classes. You may sign up for a waiting list if classes are full. Please SIGN UP FOR THIS!.   When the waiting list becomes long, sometimes new classes can be added.  [facility] Call to Register: [phone] or  [phone]   or   Register Online: [email or web address] THESE CLASSES FILL UP VERY QUICKLY, SO SIGN UP AS SOON AS YOU CAN!!! Please visit [facility] pregnancy website at [email or web address]  Childbirth Classes  Option 1: Birth & Baby Series Series of 3 weekly classes, on the same day of the week (can choose Mon-Thurs) from 6-9pm Helps you and your support person prepare for childbirth Reviews newborn care, labor & birth, cesarean birth, pain management, and comfort techniques Cost: $60 per couple for insured or self-pay, $30 per couple for Medicaid  Option 2: Weekend Birth & Baby This class is a weekend version of our Birth & Baby series.  It is designed for parents who have a difficult time fitting several weeks of classes into their schedule.   Covers the care of your newborn and the basics of labor and childbirth Friday 6:30pm-8:30pm Saturday 9am-4pm, includes lunch for you and your partner  Cost: $75 per couple for insured or self-pay, $30 per couple for Medicaid  Option 3: Natural Childbirth This series of 5 weekly classes is for expectant parents who want to learn and practice natural methods of coping with the process of labor and childbirth.  Can choose Mon or Tues, 7-9pm.   Covers relaxation, breathing, massage, visualization, role of the partner, and helpful positioning Participants learn how to be confident in their body's ability to give birth. Class empowers and helps parents make informed decisions about care. Includes discussion that will help new parents transition into the immediate postpartum period.  Cost: $75 per couple for insured or self-pay, $30 per couple for Medicaid  Option 4: Online Birth & Baby This online class offers you the freedom to complete a Birth & Baby series in the comfort of your own home.  The flexibility of this option allows you to review sections at your own pace, at times convenient to you and your support people.  It includes additional  video information, animations, quizzes and extended activities. Get organized  with helpful eClass tools, checklists, and trackers.  Cost: $60 for 60 days of online access                                                                            Other Available Classes  Baby & Me Enjoy this time to discuss newborn & infant parenting topics and family adjustment issues with other new mothers in a relaxed environment. Each week brings a new speaker or baby-centered activity. We encourage mothers and their babies (birth to crawling) to join us . You are welcome to visit this group even if you haven't delivered yet! It's wonderful to make new friends early and watch other moms interact with their babies. No registration or fee.  Big Brother/Big Sister Let your children share in the joy of a new brother or sister in this special class designed just for them. Discussion includes how families care for babies: swaddling, holding, diapering, safety, as well as how they can be helpful in their new role. This class is designed for children ages 2 to 31, but any age is welcome. Please register each child individually. $5 Breastfeeding Support Group This group is a mother-to-mother support circle where moms have the opportunity to share their breastfeeding experiences. A Breastfeeding Support nurse is present for questions and concerns. An infant scale is available for weight checks. No fee or registration.  Breastfeeding Your Baby Breastfeeding is a special time for mother and child. This class will help you feel ready to begin this important relationship. Your partner is encouraged to attend with you. Learn what to expect and feel more confident in the first days of breastfeeding your newborn. This class also addresses the most common fears and challenges of breastfeeding during the first few weeks, months, and beyond. $30 per couple Caring for Baby This class is for expectant and adoptive parents who want to  learn and practice the most up-to-date newborn care for their babies. Focus is on birth through first six weeks of life. Topics include feeding, bathing, diapering, crying, umbilical cord care, circumcision care and safe sleep. Parents learn how to recognize symptoms of illness and when to call the pediatrician. Register only the mom-to-be and your partner can plan to come with you. (*Note: This class is included in the Birth & Baby series and the Weekend Birth & Baby classes.) $10 per couple Comfort Techniques & Tour This 2-hour interactive class is designed for those who either do not wish to take the Birth & Baby series or for those who prefer our online childbirth class, but don't want to miss the opportunity to learn and practice hands-on techniques. These skills can help relieve some of the discomfort of labor and encourage your baby to rotate toward the best position for birth. You and your partner will be able to try a variety of labor positions with birth balls and rebozos as well as practice breathing, relaxation, and visual techniques. $20 per couple [employer] [facility] This course offers Dads-to-be the tools and knowledge needed to feel confident on their journey to becoming new fathers. Experienced dads, who have been trained as coaches, teach dads-to-be how to hold, comfort, diapers, swaddle and play with their infant while being able  to support the new mom as well. $25 Grandparent Love Expecting a grandbaby? Learn about the latest infant care and safety recommendations and ways to support your own child as he or she transitions into the parenting role. $10 per person Infant and Child CPR Parents, grandparents, babysitters, and friends learn Cardio-Pulmonary Resuscitation skills for infants and children. You will also learn how to treat both conscious and unconscious choking infants and children. Register each participant individually. (Note: This Family & Friends program does not offer  certification.) $20 per person Marvelous Multiples Expecting twins, triplets, or more? This free 2-hour class covers the differences in labor, birth, parenting, and breastfeeding issues that face multiples' parents.  Maternity Care Center Virtual Tour  Online virtual tour of the new [facility] Talk This free mom-led group offers support and connection to mothers as they journey through the adjustments and struggles of that sometimes overwhelming first year after the birth of a child. A member of our staff will be present to share resources and additional support if needed, as you care for yourself and baby. You are welcome to visit this group before you deliver! It's wonderful to meet new friends early and watch other moms interact with their babies.  Waterbirth Class Interested in a waterbirth? This free informational class will help you discover whether waterbirth is the right fit for you and is required if you are planning a waterbirth. Education about waterbirth itself, supplies you may need, and what you may need from your support team is included in this class. Partners are encouraged to come.

## 2024-10-14 LAB — CBC/D/PLT+RPR+RH+ABO+RUBIGG...
Antibody Screen: NEGATIVE
Basophils Absolute: 0 x10E3/uL (ref 0.0–0.2)
Basos: 0 %
EOS (ABSOLUTE): 0.3 x10E3/uL (ref 0.0–0.4)
Eos: 4 %
HCV Ab: NONREACTIVE
HIV Screen 4th Generation wRfx: NONREACTIVE
Hematocrit: 37.6 % (ref 34.0–46.6)
Hemoglobin: 12.1 g/dL (ref 11.1–15.9)
Hepatitis B Surface Ag: NEGATIVE
Immature Grans (Abs): 0 x10E3/uL (ref 0.0–0.1)
Immature Granulocytes: 0 %
Lymphocytes Absolute: 2 x10E3/uL (ref 0.7–3.1)
Lymphs: 26 %
MCH: 26.4 pg — ABNORMAL LOW (ref 26.6–33.0)
MCHC: 32.2 g/dL (ref 31.5–35.7)
MCV: 82 fL (ref 79–97)
Monocytes Absolute: 0.4 x10E3/uL (ref 0.1–0.9)
Monocytes: 5 %
Neutrophils Absolute: 4.7 x10E3/uL (ref 1.4–7.0)
Neutrophils: 65 %
Platelets: 257 x10E3/uL (ref 150–450)
RBC: 4.59 x10E6/uL (ref 3.77–5.28)
RDW: 14 % (ref 11.7–15.4)
RPR Ser Ql: NONREACTIVE
Rh Factor: POSITIVE
Rubella Antibodies, IGG: <0.9 {index} — ABNORMAL LOW (ref >0.99)
WBC: 7.4 x10E3/uL (ref 3.4–10.8)

## 2024-10-14 LAB — PROTEIN / CREATININE RATIO, URINE
Creatinine, Urine: 127.2 mg/dL
Protein, Ur: 15 mg/dL
Protein/Creat Ratio: 118 mg/g{creat} (ref 0–200)

## 2024-10-14 LAB — COMPREHENSIVE METABOLIC PANEL WITH GFR
ALT: 9 IU/L (ref 0–32)
AST: 11 IU/L (ref 0–40)
Albumin: 4.4 g/dL (ref 4.0–5.0)
Alkaline Phosphatase: 99 IU/L (ref 41–116)
BUN/Creatinine Ratio: 13 (ref 9–23)
BUN: 8 mg/dL (ref 6–20)
Bilirubin Total: 0.4 mg/dL (ref 0.0–1.2)
CO2: 21 mmol/L (ref 20–29)
Calcium: 9.8 mg/dL (ref 8.7–10.2)
Chloride: 104 mmol/L (ref 96–106)
Creatinine, Ser: 0.63 mg/dL (ref 0.57–1.00)
Globulin, Total: 2.7 g/dL (ref 1.5–4.5)
Glucose: 79 mg/dL (ref 70–99)
Potassium: 4.4 mmol/L (ref 3.5–5.2)
Sodium: 137 mmol/L (ref 134–144)
Total Protein: 7.1 g/dL (ref 6.0–8.5)
eGFR: 126 mL/min/1.73 (ref >59)

## 2024-10-14 LAB — HCV INTERPRETATION

## 2024-10-14 LAB — HEMOGLOBIN A1C
Est. average glucose Bld gHb Est-mCnc: 100 mg/dL
Hgb A1c MFr Bld: 5.1 % (ref 4.8–5.6)

## 2024-10-15 ENCOUNTER — Encounter: Payer: Self-pay | Admitting: Advanced Practice Midwife

## 2024-10-15 LAB — URINE CULTURE

## 2024-10-17 ENCOUNTER — Encounter: Payer: Self-pay | Admitting: Advanced Practice Midwife

## 2024-10-17 LAB — CERVICOVAGINAL ANCILLARY ONLY
Chlamydia: NEGATIVE
Comment: NEGATIVE
Comment: NORMAL
Neisseria Gonorrhea: NEGATIVE

## 2024-10-19 ENCOUNTER — Encounter: Payer: Self-pay | Admitting: Advanced Practice Midwife

## 2024-10-20 ENCOUNTER — Encounter: Payer: Self-pay | Admitting: Advanced Practice Midwife

## 2024-10-24 LAB — PANORAMA PRENATAL TEST FULL PANEL:PANORAMA TEST PLUS 5 ADDITIONAL MICRODELETIONS
22Q11.2 DELETION SYNDROME RESULT TEXT: HIGH — AB
FETAL FRACTION: 5.3

## 2024-10-25 ENCOUNTER — Other Ambulatory Visit: Payer: Self-pay | Admitting: Advanced Practice Midwife

## 2024-10-25 ENCOUNTER — Encounter: Payer: Self-pay | Admitting: Advanced Practice Midwife

## 2024-10-25 DIAGNOSIS — O285 Abnormal chromosomal and genetic finding on antenatal screening of mother: Secondary | ICD-10-CM | POA: Insufficient documentation

## 2024-11-09 ENCOUNTER — Ambulatory Visit: Admitting: Vascular Surgery

## 2024-11-11 ENCOUNTER — Ambulatory Visit: Admitting: Obstetrics & Gynecology

## 2024-11-11 ENCOUNTER — Encounter: Payer: Self-pay | Admitting: Obstetrics & Gynecology

## 2024-11-11 VITALS — BP 115/74 | HR 86 | Wt 184.4 lb

## 2024-11-11 DIAGNOSIS — Z3A17 17 weeks gestation of pregnancy: Secondary | ICD-10-CM

## 2024-11-11 DIAGNOSIS — O285 Abnormal chromosomal and genetic finding on antenatal screening of mother: Secondary | ICD-10-CM | POA: Diagnosis not present

## 2024-11-11 DIAGNOSIS — Z3482 Encounter for supervision of other normal pregnancy, second trimester: Secondary | ICD-10-CM

## 2024-11-11 NOTE — Progress Notes (Signed)
 "  HIGH-RISK PREGNANCY VISIT Patient name: Shelby Acevedo MRN [id number]  Date of birth: April 14, 2001 Chief Complaint:   Routine Prenatal Visit  History of Present Illness:   Shelby Acevedo is a 24 y.o. [id number] female at [redacted]w[redacted]d with an Estimated Date of Delivery: 04/19/25 being seen today for ongoing management of a high-risk pregnancy complicated by:  -elevated risk 22q11- likely maternal  Today she reports no complaints.   Contractions: Not present. Vag. Bleeding: None.  Movement: Present. denies leaking of fluid.      10/13/2024    3:03 PM 07/09/2023    9:31 AM 04/02/2023   11:08 AM 02/05/2023   10:40 AM 09/06/2020    2:47 PM  Depression screen PHQ 2/9  Decreased Interest 0 0 0 0 0  Down, Depressed, Hopeless 0 0 0 0 1  PHQ - 2 Score 0 0 0 0 1  Altered sleeping 1 0 0 1 0  Tired, decreased energy 1 0 2 1 0  Change in appetite 0  0 1 0  Feeling bad or failure about yourself  0 0 0 0 0  Trouble concentrating 0 0 0 0 0  Moving slowly or fidgety/restless 0 0 0 0 0  Suicidal thoughts 0 0 0 0 0  PHQ-9 Score 2 0  2  3  1    Difficult doing work/chores     Not difficult at all     Data saved with a previous flowsheet row definition     Current Outpatient Medications  Medication Instructions   albuterol (VENTOLIN HFA) 108 (90 Base) MCG/ACT inhaler 2 puffs, 4 times daily   aspirin  EC 81 mg, Oral, Daily   Blood Pressure Monitoring (BLOOD PRESSURE CUFF) MISC 1 kit, Does not apply, As directed   Doxylamine -Pyridoxine  (DICLEGIS ) 10-10 MG TBEC Take 2 qhs; may also take one in am and one in afternoon prn nausea   ondansetron  (ZOFRAN ) 4 mg, Oral, Every 8 hours PRN   prenatal vitamin w/FE, FA (PRENATAL 1 + 1) 27-1 MG TABS tablet Take 1 tablet by mouth daily     Review of Systems:   Pertinent items are noted in HPI Denies abnormal vaginal discharge w/ itching/odor/irritation, headaches, visual changes, shortness of breath, chest pain, abdominal pain, severe nausea/vomiting, or problems with  urination or bowel movements unless otherwise stated above. Pertinent History Reviewed:  Reviewed past medical,surgical, social, obstetrical and family history.  Reviewed problem list, medications and allergies. Physical Assessment:   Vitals:   11/11/24 0846  BP: 115/74  Pulse: 86  Weight: 184 lb 6.4 oz (83.6 kg)  Body mass index is 31.65 kg/m.           Physical Examination:   General appearance: alert, well appearing, and in no distress  Mental status: normal mood, behavior, speech, dress, motor activity, and thought processes  Skin: warm & dry   Extremities:      Cardiovascular: normal heart rate noted  Respiratory: normal respiratory effort, no distress  Abdomen: gravid, soft, non-tender  Pelvic: Cervical exam deferred         Fetal Status:     Movement: Present    Fetal Surveillance Testing today: BSUS- 155bpm   Chaperone: N/A    No results found for this or any previous visit (from the past 24 hours).   Assessment & Plan:  High-risk pregnancy: [id number] at [redacted]w[redacted]d with an Estimated Date of Delivery: 04/19/25   1) Increased risk 22q11 Likely maternal as pt has had prior abnormal in prior pregnancy  Declined genetic counseling, declined AFP today  -routine OB care -anatomy scan scheduled  Meds: No orders of the defined types were placed in this encounter.   Labs/procedures today: NA  Treatment Plan:  routine OB Care  Reviewed: Preterm labor symptoms and general obstetric precautions including but not limited to vaginal bleeding, contractions, leaking of fluid and fetal movement were reviewed in detail with the patient.  All questions were answered.   Follow-up: Return for as scheduled 1/29.   Future Appointments  Date Time Provider Department Center  12/01/2024  8:30 AM [facility] - [facility] US  [facility] None  12/01/2024  9:30 AM Jasan Doughtie, DO [facility]    No orders of the defined types were placed in this encounter.   Juaquin Ludington, DO Attending  Obstetrician & Gynecologist, [facility] for [facility], [facility]    "

## 2024-12-01 ENCOUNTER — Encounter: Payer: Self-pay | Admitting: Obstetrics & Gynecology

## 2024-12-01 ENCOUNTER — Ambulatory Visit

## 2024-12-01 ENCOUNTER — Ambulatory Visit (INDEPENDENT_AMBULATORY_CARE_PROVIDER_SITE_OTHER): Admitting: Obstetrics & Gynecology

## 2024-12-01 VITALS — BP 116/74 | Wt 180.0 lb

## 2024-12-01 DIAGNOSIS — Z3482 Encounter for supervision of other normal pregnancy, second trimester: Secondary | ICD-10-CM | POA: Diagnosis not present

## 2024-12-01 DIAGNOSIS — Z3A20 20 weeks gestation of pregnancy: Secondary | ICD-10-CM

## 2024-12-01 DIAGNOSIS — Z363 Encounter for antenatal screening for malformations: Secondary | ICD-10-CM

## 2024-12-01 DIAGNOSIS — Z3480 Encounter for supervision of other normal pregnancy, unspecified trimester: Secondary | ICD-10-CM

## 2024-12-01 NOTE — Progress Notes (Signed)
 US  20+1 wks,cephalic,anterior placenta gr 0,CX 3 cm,normal ovaries,FHR 145 bpm,SVP of fluid 5.6 cm,EFW 342 g 51%,anatomy complete,no obvious abnormalities

## 2024-12-01 NOTE — Progress Notes (Signed)
" ° °  LOW-RISK PREGNANCY VISIT Patient name: Shelby Acevedo MRN [id number]  Date of birth: 21-Jun-2001 Chief Complaint:   Routine Prenatal Visit  History of Present Illness:   Shelby Acevedo is a 24 y.o. [id number] female at [redacted]w[redacted]d with an Estimated Date of Delivery: 04/19/25 being seen today for ongoing management of a low-risk pregnancy.     10/13/2024    3:03 PM 07/09/2023    9:31 AM 04/02/2023   11:08 AM 02/05/2023   10:40 AM 09/06/2020    2:47 PM  Depression screen PHQ 2/9  Decreased Interest 0 0 0 0 0  Down, Depressed, Hopeless 0 0 0 0 1  PHQ - 2 Score 0 0 0 0 1  Altered sleeping 1 0 0 1 0  Tired, decreased energy 1 0 2 1 0  Change in appetite 0  0 1 0  Feeling bad or failure about yourself  0 0 0 0 0  Trouble concentrating 0 0 0 0 0  Moving slowly or fidgety/restless 0 0 0 0 0  Suicidal thoughts 0 0 0 0 0  PHQ-9 Score 2 0  2  3  1    Difficult doing work/chores     Not difficult at all     Data saved with a previous flowsheet row definition    Today she reports no complaints. Contractions: Not present. Vag. Bleeding: None.  Movement: Present. reports leaking of fluid. Review of Systems:   Pertinent items are noted in HPI Denies abnormal vaginal discharge w/ itching/odor/irritation, headaches, visual changes, shortness of breath, chest pain, abdominal pain, severe nausea/vomiting, or problems with urination or bowel movements unless otherwise stated above. Pertinent History Reviewed:  Reviewed past medical,surgical, social, obstetrical and family history.  Reviewed problem list, medications and allergies.  Physical Assessment:   Vitals:   12/01/24 0927  BP: 116/74  Weight: 180 lb (81.6 kg)  Body mass index is 30.9 kg/m.        Physical Examination:   General appearance: Well appearing, and in no distress  Mental status: Alert, oriented to person, place, and time  Skin: Warm & dry  Respiratory: Normal respiratory effort, no distress  Abdomen: Soft, gravid,  nontender  Pelvic: Cervical exam deferred         Extremities:  no edema  Psych:  mood and affect appropriate  Fetal Status:     Movement: Present   cephalic,anterior placenta gr 0,CX 3 cm,normal ovaries,FHR 145 bpm,SVP of fluid 5.6 cm,EFW 342 g 51%,anatomy complete,no obvious abnormalities   Chaperone: n/a    No results found for this or any previous visit (from the past 24 hours).   Assessment & Plan:  1) Low-risk pregnancy [id number] at [redacted]w[redacted]d with an Estimated Date of Delivery: 04/19/25   Normal anatomy scan   Meds: No orders of the defined types were placed in this encounter.  Labs/procedures today: anatomy scan  Plan:  Continue routine obstetrical care  Next visit: prefers in person    Reviewed: Preterm labor symptoms and general obstetric precautions including but not limited to vaginal bleeding, contractions, leaking of fluid and fetal movement were reviewed in detail with the patient.  All questions were answered.  Follow-up: Return in about 4 weeks (around 12/29/2024) for LROB visit.  No orders of the defined types were placed in this encounter.   Karess Harner, DO Attending Obstetrician & Gynecologist, [facility] for [facility], [facility]    "

## 2024-12-29 ENCOUNTER — Encounter: Admitting: Advanced Practice Midwife
